# Patient Record
Sex: Female | Born: 1945 | Race: White | Hispanic: No | Marital: Married | Smoking: Former smoker
Health system: Southern US, Community
[De-identification: ages and names within clinical notes are randomized; demographics above are authoritative.]

## PROBLEM LIST (undated history)

## (undated) DIAGNOSIS — J439 Emphysema, unspecified: Secondary | ICD-10-CM

## (undated) DIAGNOSIS — I10 Essential (primary) hypertension: Secondary | ICD-10-CM

## (undated) DIAGNOSIS — K219 Gastro-esophageal reflux disease without esophagitis: Secondary | ICD-10-CM

## (undated) HISTORY — PX: OTHER SURGICAL HISTORY: SHX169

## (undated) HISTORY — DX: Essential (primary) hypertension: I10

## (undated) HISTORY — DX: Gastro-esophageal reflux disease without esophagitis: K21.9

---
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
## 1979-08-25 HISTORY — PX: DILATION AND CURETTAGE OF UTERUS: SHX78

## 2012-01-26 DIAGNOSIS — L02419 Cutaneous abscess of limb, unspecified: Secondary | ICD-10-CM | POA: Diagnosis not present

## 2012-01-26 DIAGNOSIS — L03119 Cellulitis of unspecified part of limb: Secondary | ICD-10-CM | POA: Diagnosis not present

## 2012-01-26 DIAGNOSIS — W57XXXA Bitten or stung by nonvenomous insect and other nonvenomous arthropods, initial encounter: Secondary | ICD-10-CM | POA: Diagnosis not present

## 2013-05-15 DIAGNOSIS — I10 Essential (primary) hypertension: Secondary | ICD-10-CM | POA: Diagnosis not present

## 2013-08-14 ENCOUNTER — Ambulatory Visit (INDEPENDENT_AMBULATORY_CARE_PROVIDER_SITE_OTHER): Payer: Medicare Other | Admitting: Family Medicine

## 2013-08-14 VITALS — BP 170/96 | HR 63 | Temp 98.6°F | Resp 16 | Ht 62.25 in | Wt 147.2 lb

## 2013-08-14 DIAGNOSIS — I10 Essential (primary) hypertension: Secondary | ICD-10-CM

## 2013-08-14 MED ORDER — LISINOPRIL 40 MG PO TABS: 40.0000 mg | ORAL_TABLET | Freq: Every day | ORAL | Status: DC

## 2013-08-14 NOTE — Patient Instructions (Signed)

## 2013-08-14 NOTE — Progress Notes (Signed)
Patient ID: Laurie Dunn MRN: [id number], DOB: 08/02/1946, 67 y.o. Date of Encounter: 08/14/2013, 3:02 PM  Primary Physician: No primary provider on file.  Chief Complaint: HTN  HPI: 67 y.o. year old female with 4 year history below presents for hypertension follow up.  Patient moved from [place].  Her husband teaches [place] at [employer]. She is watching her salt intake.  She hasn't been exercising. Patient has undergone cardiac evaluation in the past. No CP, HA, visual changes, or focal deficits.   Past Medical History  Diagnosis Date  . GERD (gastroesophageal reflux disease)   . Hypertension      Home Meds: Prior to Admission medications   Medication Sig Start Date End Date Taking? Authorizing Provider  lisinopril (PRINIVIL,ZESTRIL) 40 MG tablet Take 40 mg by mouth daily.   Yes Historical Provider, MD    Allergies: No Known Allergies  History   Social History  . Marital Status: Married    Spouse Name: N/A    Number of Children: N/A  . Years of Education: N/A   Occupational History  . Not on file.   Social History Main Topics  . Smoking status: Former Games developer  . Smokeless tobacco: Not on file  . Alcohol Use: Yes  . Drug Use: No  . Sexual Activity: No   Other Topics Concern  . Not on file   Social History Narrative  . No narrative on file     Family History  Problem Relation Age of Onset  . Cancer Mother   . Cancer Father   . Cancer Sister   . Hypertension Brother     Review of Systems: Constitutional: negative for chills, fever, night sweats, weight changes, or fatigue  HEENT: negative for vision changes, hearing loss, congestion, rhinorrhea, ST, epistaxis, or sinus pressure Cardiovascular: negative for chest pain, palpitations, or DOE Respiratory: negative for hemoptysis, wheezing, shortness of breath, or cough Abdominal: negative for abdominal pain, nausea, vomiting, diarrhea, or constipation Dermatological: negative for rash Neurologic:  negative for headache, dizziness, or syncope All other systems reviewed and are otherwise negative with the exception to those above and in the HPI.   Physical Exam: Blood pressure 170/96, pulse 63, temperature 98.6 F (37 C), temperature source Oral, resp. rate 16, height 5' 2.25" (1.581 m), weight 147 lb 3.2 oz (66.769 kg), SpO2 98.00%., Body mass index is 26.71 kg/(m^2). General: Well developed, well nourished, in no acute distress. Head: Normocephalic, atraumatic, eyes without discharge, sclera non-icteric, nares are without discharge. Bilateral auditory canals clear, TM's are without perforation, pearly grey and translucent with reflective cone of light bilaterally. Oral cavity moist, posterior pharynx without exudate, erythema, peritonsillar abscess, or post nasal drip.  Neck: Supple. No thyromegaly. Full ROM. No lymphadenopathy. No carotid bruits. Lungs: Clear bilaterally to auscultation without wheezes, rales, or rhonchi. Breathing is unlabored. Heart: RRR with S1 S2. No murmurs, rubs, or gallops appreciated.  Abdomen: Soft, non-tender, non-distended with normoactive bowel sounds. No hepatosplenomegaly. No rebound/guarding. No obvious abdominal masses. Msk:  Strength and tone normal for age. Extremities/Skin: Warm and dry. No clubbing or cyanosis. No edema. No rashes or suspicious lesions. Distal pulses 2+ and equal bilaterally. Neuro: Alert and oriented X 3. Moves all extremities spontaneously. Gait is normal. CNII-XII grossly in tact. DTR 2+, cerebellar function intact. Rhomberg normal. Psych:  Responds to questions appropriately with a normal affect.    ASSESSMENT AND PLAN:  67 y.o. year old female with hypertension. -No diagnosis found. Lisinopril 40 mg daily.  Signed, Elvina Sidle,  MD 08/14/2013 3:02 PM

## 2013-10-17 DIAGNOSIS — K219 Gastro-esophageal reflux disease without esophagitis: Secondary | ICD-10-CM | POA: Diagnosis not present

## 2013-10-17 DIAGNOSIS — R5382 Chronic fatigue, unspecified: Secondary | ICD-10-CM | POA: Diagnosis not present

## 2013-10-17 DIAGNOSIS — I10 Essential (primary) hypertension: Secondary | ICD-10-CM | POA: Diagnosis not present

## 2013-10-17 DIAGNOSIS — G9332 Myalgic encephalomyelitis/chronic fatigue syndrome: Secondary | ICD-10-CM | POA: Diagnosis not present

## 2013-10-17 DIAGNOSIS — R000 Tachycardia, unspecified: Secondary | ICD-10-CM | POA: Diagnosis not present

## 2013-10-24 ENCOUNTER — Other Ambulatory Visit: Payer: Self-pay | Admitting: Family Medicine

## 2013-10-24 ENCOUNTER — Ambulatory Visit
Admission: RE | Admit: 2013-10-24 | Discharge: 2013-10-24 | Disposition: A | Discharge status: Home / Self Care | Payer: Medicare Other | Source: Ambulatory Visit | Attending: Family Medicine | Admitting: Family Medicine

## 2013-10-24 DIAGNOSIS — R5382 Chronic fatigue, unspecified: Secondary | ICD-10-CM

## 2013-10-24 DIAGNOSIS — R0602 Shortness of breath: Secondary | ICD-10-CM | POA: Diagnosis not present

## 2013-10-24 DIAGNOSIS — Z87891 Personal history of nicotine dependence: Secondary | ICD-10-CM

## 2013-11-01 DIAGNOSIS — R000 Tachycardia, unspecified: Secondary | ICD-10-CM | POA: Diagnosis not present

## 2013-11-01 DIAGNOSIS — Z1231 Encounter for screening mammogram for malignant neoplasm of breast: Secondary | ICD-10-CM | POA: Diagnosis not present

## 2013-11-03 DIAGNOSIS — I781 Nevus, non-neoplastic: Secondary | ICD-10-CM | POA: Diagnosis not present

## 2013-11-03 DIAGNOSIS — L821 Other seborrheic keratosis: Secondary | ICD-10-CM | POA: Diagnosis not present

## 2013-11-03 DIAGNOSIS — D235 Other benign neoplasm of skin of trunk: Secondary | ICD-10-CM | POA: Diagnosis not present

## 2013-11-03 DIAGNOSIS — L719 Rosacea, unspecified: Secondary | ICD-10-CM | POA: Diagnosis not present

## 2013-11-07 DIAGNOSIS — I10 Essential (primary) hypertension: Secondary | ICD-10-CM | POA: Diagnosis not present

## 2013-11-07 DIAGNOSIS — R5382 Chronic fatigue, unspecified: Secondary | ICD-10-CM | POA: Diagnosis not present

## 2013-11-07 DIAGNOSIS — G9332 Myalgic encephalomyelitis/chronic fatigue syndrome: Secondary | ICD-10-CM | POA: Diagnosis not present

## 2013-11-07 DIAGNOSIS — R5381 Other malaise: Secondary | ICD-10-CM | POA: Diagnosis not present

## 2013-12-07 DIAGNOSIS — J438 Other emphysema: Secondary | ICD-10-CM | POA: Diagnosis not present

## 2014-01-05 DIAGNOSIS — IMO0001 Reserved for inherently not codable concepts without codable children: Secondary | ICD-10-CM | POA: Diagnosis not present

## 2014-02-01 DIAGNOSIS — R5383 Other fatigue: Secondary | ICD-10-CM | POA: Diagnosis not present

## 2014-02-01 DIAGNOSIS — R5381 Other malaise: Secondary | ICD-10-CM | POA: Diagnosis not present

## 2014-02-01 DIAGNOSIS — I059 Rheumatic mitral valve disease, unspecified: Secondary | ICD-10-CM | POA: Diagnosis not present

## 2014-02-06 DIAGNOSIS — J438 Other emphysema: Secondary | ICD-10-CM | POA: Diagnosis not present

## 2014-02-06 DIAGNOSIS — I498 Other specified cardiac arrhythmias: Secondary | ICD-10-CM | POA: Diagnosis not present

## 2014-02-28 DIAGNOSIS — I10 Essential (primary) hypertension: Secondary | ICD-10-CM | POA: Diagnosis not present

## 2014-02-28 DIAGNOSIS — R079 Chest pain, unspecified: Secondary | ICD-10-CM | POA: Diagnosis not present

## 2014-02-28 DIAGNOSIS — R5383 Other fatigue: Secondary | ICD-10-CM | POA: Diagnosis not present

## 2014-02-28 DIAGNOSIS — R5381 Other malaise: Secondary | ICD-10-CM | POA: Diagnosis not present

## 2014-03-16 DIAGNOSIS — R079 Chest pain, unspecified: Secondary | ICD-10-CM | POA: Diagnosis not present

## 2014-03-26 DIAGNOSIS — I10 Essential (primary) hypertension: Secondary | ICD-10-CM | POA: Diagnosis not present

## 2014-03-26 DIAGNOSIS — R0609 Other forms of dyspnea: Secondary | ICD-10-CM | POA: Diagnosis not present

## 2014-03-26 DIAGNOSIS — R079 Chest pain, unspecified: Secondary | ICD-10-CM | POA: Diagnosis not present

## 2014-07-11 DIAGNOSIS — I10 Essential (primary) hypertension: Secondary | ICD-10-CM | POA: Diagnosis not present

## 2014-07-11 DIAGNOSIS — J439 Emphysema, unspecified: Secondary | ICD-10-CM | POA: Diagnosis not present

## 2014-07-17 DIAGNOSIS — N3090 Cystitis, unspecified without hematuria: Secondary | ICD-10-CM | POA: Diagnosis not present

## 2014-07-17 DIAGNOSIS — Z0000 Encounter for general adult medical examination without abnormal findings: Secondary | ICD-10-CM | POA: Diagnosis not present

## 2014-08-06 ENCOUNTER — Other Ambulatory Visit: Payer: Self-pay | Admitting: Family Medicine

## 2014-09-04 DIAGNOSIS — J439 Emphysema, unspecified: Secondary | ICD-10-CM | POA: Diagnosis not present

## 2014-09-25 DIAGNOSIS — J449 Chronic obstructive pulmonary disease, unspecified: Secondary | ICD-10-CM | POA: Diagnosis not present

## 2014-12-01 ENCOUNTER — Emergency Department (INDEPENDENT_AMBULATORY_CARE_PROVIDER_SITE_OTHER)
Admission: EM | Admit: 2014-12-01 | Discharge: 2014-12-01 | Disposition: A | Discharge status: Home / Self Care | Payer: Medicare Other | Source: Home / Self Care | Attending: Family Medicine | Admitting: Family Medicine

## 2014-12-01 ENCOUNTER — Encounter (HOSPITAL_COMMUNITY): Payer: Self-pay | Admitting: *Deleted

## 2014-12-01 DIAGNOSIS — L259 Unspecified contact dermatitis, unspecified cause: Secondary | ICD-10-CM | POA: Diagnosis not present

## 2014-12-01 HISTORY — DX: Emphysema, unspecified: J43.9

## 2014-12-01 MED ORDER — DIPHENHYDRAMINE HCL 50 MG/ML IJ SOLN
50.0000 mg | Freq: Once | INTRAMUSCULAR | Status: AC
  Administered 2014-12-01: 50 mg via INTRAMUSCULAR

## 2014-12-01 MED ORDER — PREDNISONE 10 MG PO TABS: ORAL_TABLET | ORAL | Status: DC

## 2014-12-01 MED ORDER — FAMOTIDINE 20 MG PO TABS: 20.0000 mg | ORAL_TABLET | Freq: Two times a day (BID) | ORAL | Status: DC

## 2014-12-01 MED ORDER — PREDNISONE 20 MG PO TABS
60.0000 mg | ORAL_TABLET | Freq: Once | ORAL | Status: AC
  Administered 2014-12-01: 60 mg via ORAL

## 2014-12-01 MED ORDER — HYDROXYZINE HCL 10 MG PO TABS: 10.0000 mg | ORAL_TABLET | Freq: Three times a day (TID) | ORAL | Status: DC | PRN

## 2014-12-01 MED ORDER — DIPHENHYDRAMINE HCL 50 MG/ML IJ SOLN
INTRAMUSCULAR | Status: AC
  Filled 2014-12-01: qty 1

## 2014-12-01 MED ORDER — FAMOTIDINE 20 MG PO TABS
20.0000 mg | ORAL_TABLET | Freq: Once | ORAL | Status: AC
  Administered 2014-12-01: 20 mg via ORAL

## 2014-12-01 MED ORDER — FAMOTIDINE 20 MG PO TABS
ORAL_TABLET | ORAL | Status: AC
  Filled 2014-12-01: qty 1

## 2014-12-01 MED ORDER — PREDNISONE 20 MG PO TABS
ORAL_TABLET | ORAL | Status: AC
  Filled 2014-12-01: qty 3

## 2014-12-01 NOTE — ED Provider Notes (Signed)
CSN: [id number]     Arrival date & time 12/01/14  1029 History   First MD Initiated Contact with Patient 12/01/14 1104     Chief Complaint  Patient presents with  . Allergic Reaction   (Consider location/radiation/quality/duration/timing/severity/associated sxs/prior Treatment) HPI Comments: Patient reports she spent much of the day yesterday clearing vines (thought to be kudzu) from a tree in her yard. Noticed some areas on hands that were abraded and somewhat itchy yesterday evening and when she woke this morning, discovered itchy red rash on forearms, face and neck. States her throat has felt a bit scratchy today as well. Denies difficulty breathing, speaking or swallowing. Denies sensation of tongue or lip swelling. Endorses prior allergic dermatitis with exposure to poison ivy.   Patient is a 69 y.o. female presenting with allergic reaction. The history is provided by the patient.  Allergic Reaction   Past Medical History  Diagnosis Date  . GERD (gastroesophageal reflux disease)   . Hypertension   . Mild emphysema    Past Surgical History  Procedure Laterality Date  . Oopherrectomy    . Tonsillectomy  1953  . Dilation and curettage of uterus  1981   Family History  Problem Relation Age of Onset  . Cancer Mother   . Cancer Father   . Cancer Sister   . Hypertension Brother    History  Substance Use Topics  . Smoking status: Former Smoker    Quit date: 08/24/2012  . Smokeless tobacco: Not on file  . Alcohol Use: 3.6 - 4.8 oz/week    6-8 Cans of beer per week   OB History    No data available     Review of Systems  All other systems reviewed and are negative.   Allergies  Review of patient's allergies indicates no known allergies.  Home Medications   Prior to Admission medications   Medication Sig Start Date End Date Taking? Authorizing Provider  amLODipine (NORVASC) 5 MG tablet Take 5 mg by mouth daily.   Yes Historical Provider, MD  lisinopril  (PRINIVIL,ZESTRIL) 40 MG tablet Take 1 tablet (40 mg total) by mouth daily. PATIENT NEEDS OFFICE VISIT FOR ADDITIONAL REFILLS 08/07/14  Yes Robyn Haber, MD  famotidine (PEPCID) 20 MG tablet Take 1 tablet (20 mg total) by mouth 2 (two) times daily. X 5 days 12/01/14   Audelia Hives Oluwademilade Mckiver, PA  hydrOXYzine (ATARAX/VISTARIL) 10 MG tablet Take 1 tablet (10 mg total) by mouth 3 (three) times daily as needed for itching. 12/01/14   Lutricia Feil, PA  predniSONE (DELTASONE) 10 MG tablet Beginning 12/02/2014, take 4 tabs po QD day 1, 3 tabs po QD day 2, 2 tabs po QD day 3, 1 tab po QD day 4 then stop 12/01/14   Annett Gula H Solangel Mcmanaway, PA   BP 159/76 mmHg  Pulse 67  Temp(Src) 98.7 F (37.1 C) (Oral)  Resp 16  SpO2 100% Physical Exam  Constitutional: She is oriented to person, place, and time. She appears well-developed and well-nourished. No distress.  HENT:  Head: Normocephalic and atraumatic.  Right Ear: Hearing and external ear normal.  Left Ear: Hearing and external ear normal.  Nose: Nose normal.  Mouth/Throat: Uvula is midline, oropharynx is clear and moist and mucous membranes are normal. No oral lesions. No trismus in the jaw. No uvula swelling.  No swelling of lips or tongue  Eyes: Conjunctivae are normal.  Neck: Normal range of motion. Neck supple.  Cardiovascular: Normal rate, regular rhythm  and normal heart sounds.   Pulmonary/Chest: Effort normal and breath sounds normal. No stridor. No respiratory distress. She has no wheezes.  Musculoskeletal: Normal range of motion.  Neurological: She is alert and oriented to person, place, and time.  Skin: Skin is warm and dry. Rash noted.  Mildly erythematous maculopapular rash on hands, forearms, both cheeks and lateral surfaces of neck.   Psychiatric: She has a normal mood and affect. Her behavior is normal.  Nursing note and vitals reviewed.   ED Course  Procedures (including critical care time) Labs Review Labs Reviewed - No  data to display  Imaging Review No results found.   MDM   1. Contact dermatitis   Given 50mg  IM dose of benadryl along with 60 mg oral prednisone and 20 mg oral pepcid at [facility] Will continue prednisone taper at home over the next 4 days along with Atarax and pepcid. Advised regarding symptoms of concern that should prompt urgent re-evaluation and patient voices understanding.     Lutricia Feil, [place] 12/01/14 1146

## 2014-12-01 NOTE — ED Notes (Signed)
Noted a blister on R index finger on Thur. Late afternoon after cutting kudzu.  Last night she noted rough patch on her R cheek with itching.   Was on R cheek, throat and forearms this morning.  Had a severe reaction as a child to poison ivy after she ate it at camp.

## 2014-12-01 NOTE — Discharge Instructions (Signed)

## 2014-12-10 ENCOUNTER — Encounter (HOSPITAL_COMMUNITY): Payer: Self-pay | Admitting: Emergency Medicine

## 2014-12-10 ENCOUNTER — Emergency Department (INDEPENDENT_AMBULATORY_CARE_PROVIDER_SITE_OTHER)
Admission: EM | Admit: 2014-12-10 | Discharge: 2014-12-10 | Disposition: A | Discharge status: Home / Self Care | Payer: Medicare Other | Source: Home / Self Care | Attending: Emergency Medicine | Admitting: Emergency Medicine

## 2014-12-10 DIAGNOSIS — L259 Unspecified contact dermatitis, unspecified cause: Secondary | ICD-10-CM

## 2014-12-10 MED ORDER — HYDROXYZINE HCL 25 MG PO TABS: 25.0000 mg | ORAL_TABLET | Freq: Three times a day (TID) | ORAL | Status: DC | PRN

## 2014-12-10 MED ORDER — PREDNISONE 10 MG PO TABS: ORAL_TABLET | ORAL | Status: DC

## 2014-12-10 NOTE — ED Provider Notes (Signed)
CSN: [id number]     Arrival date & time 12/10/14  1006 History   First MD Initiated Contact with Patient 12/10/14 1211     Chief Complaint  Patient presents with  . Rash   (Consider location/radiation/quality/duration/timing/severity/associated sxs/prior Treatment) HPI Comments: Ms. Gernert was initially evaluated at our clinic for a rash on 4-04/2015. This was felt to be a result of plant oil contact dermatitis and was prescribed 5 day course of oral steroids along with H1 and H2 blocker medications. Endorses some improvement without complete resolution while taking these medications and then once medications were complete, rash intensified. Rash spread on chest and torso and intensified on both forearms and she developed additional location of rash at left upper medial thigh. Remains pruritic. Denies any systemic symptoms such and fever or malaise. No new medications or environmental changes and no other members of the household have same rash.   Patient is a 69 y.o. female presenting with rash. The history is provided by the patient.  Rash   Past Medical History  Diagnosis Date  . GERD (gastroesophageal reflux disease)   . Hypertension   . Mild emphysema    Past Surgical History  Procedure Laterality Date  . Oopherrectomy    . Tonsillectomy  1953  . Dilation and curettage of uterus  1981   Family History  Problem Relation Age of Onset  . Cancer Mother   . Cancer Father   . Cancer Sister   . Hypertension Brother    History  Substance Use Topics  . Smoking status: Former Smoker    Quit date: 08/24/2012  . Smokeless tobacco: Not on file  . Alcohol Use: 3.6 - 4.8 oz/week    6-8 Cans of beer per week   OB History    No data available     Review of Systems  Skin: Positive for rash.  All other systems reviewed and are negative.   Allergies  Review of patient's allergies indicates no known allergies.  Home Medications   Prior to Admission medications   Medication  Sig Start Date End Date Taking? Authorizing Provider  amLODipine (NORVASC) 5 MG tablet Take 5 mg by mouth daily.    Historical Provider, MD  hydrOXYzine (ATARAX/VISTARIL) 25 MG tablet Take 1 tablet (25 mg total) by mouth every 8 (eight) hours as needed for itching. 12/10/14   Audelia Hives Johnnetta Holstine, PA  lisinopril (PRINIVIL,ZESTRIL) 40 MG tablet Take 1 tablet (40 mg total) by mouth daily. PATIENT NEEDS OFFICE VISIT FOR ADDITIONAL REFILLS 08/07/14   Robyn Haber, MD  predniSONE (DELTASONE) 10 MG tablet Take 5 tabs po QD days 1-3, 4 tabs po QD days 4-6, 3 tabs po QD days 7-9, 2 tabs po QD days 10-12, 1 tab po QD days 13-15 then stop 12/10/14   Annett Gula H Shanikqua Zarzycki, PA   BP 155/71 mmHg  Pulse 64  Temp(Src) 98 F (36.7 C) (Oral)  Resp 16  SpO2 100% Physical Exam  Constitutional: She is oriented to person, place, and time. She appears well-developed and well-nourished. No distress.  HENT:  Head: Normocephalic and atraumatic.  Eyes: Conjunctivae are normal.  Cardiovascular: Normal rate.   Pulmonary/Chest: Effort normal. She has no wheezes.  Musculoskeletal: Normal range of motion.  Neurological: She is alert and oriented to person, place, and time.  Skin: Skin is warm and dry. Rash noted.     Outlines areas are regions of maculopapular erythematous blanching rash with patches of superficial excoriation from scratching.  Psychiatric: She  has a normal mood and affect. Her behavior is normal.  Nursing note and vitals reviewed.   ED Course  Procedures (including critical care time) Labs Review Labs Reviewed - No data to display  Imaging Review No results found.   MDM   1. Contact dermatitis   Case discussed with and patient examined by Dr. Bridgett Larsson. Patient stable and otherwise in good health with prolonged/rebound response to plant oil dermatitis. Will treat with extended (15 day) prednisone taper and oral antihistamines. If no improvement, will require dermatology evaluation.      Lutricia Feil, [place] 12/10/14 1240

## 2014-12-10 NOTE — Discharge Instructions (Signed)

## 2014-12-10 NOTE — ED Notes (Signed)
Pt states that she was seen on 12/01/2014 and has a rash that has spread with no help on prescribed medications

## 2015-03-12 DIAGNOSIS — I10 Essential (primary) hypertension: Secondary | ICD-10-CM | POA: Diagnosis not present

## 2015-03-12 DIAGNOSIS — J439 Emphysema, unspecified: Secondary | ICD-10-CM | POA: Diagnosis not present

## 2015-05-28 DIAGNOSIS — J439 Emphysema, unspecified: Secondary | ICD-10-CM | POA: Diagnosis not present

## 2015-05-28 DIAGNOSIS — R413 Other amnesia: Secondary | ICD-10-CM | POA: Diagnosis not present

## 2015-05-29 DIAGNOSIS — J439 Emphysema, unspecified: Secondary | ICD-10-CM | POA: Diagnosis not present

## 2015-05-29 DIAGNOSIS — R413 Other amnesia: Secondary | ICD-10-CM | POA: Diagnosis not present

## 2015-06-05 ENCOUNTER — Other Ambulatory Visit: Payer: Self-pay | Admitting: Family Medicine

## 2015-06-05 DIAGNOSIS — R413 Other amnesia: Secondary | ICD-10-CM

## 2015-06-20 ENCOUNTER — Ambulatory Visit
Admission: RE | Admit: 2015-06-20 | Discharge: 2015-06-20 | Disposition: A | Discharge status: Home / Self Care | Payer: Medicare Other | Source: Ambulatory Visit | Attending: Family Medicine | Admitting: Family Medicine

## 2015-06-20 DIAGNOSIS — R413 Other amnesia: Secondary | ICD-10-CM | POA: Diagnosis not present

## 2015-06-20 DIAGNOSIS — R5383 Other fatigue: Secondary | ICD-10-CM | POA: Diagnosis not present

## 2015-06-20 MED ORDER — GADOBENATE DIMEGLUMINE 529 MG/ML IV SOLN
15.0000 mL | Freq: Once | INTRAVENOUS | Status: AC | PRN
  Administered 2015-06-20: 15 mL via INTRAVENOUS

## 2015-09-09 DIAGNOSIS — E785 Hyperlipidemia, unspecified: Secondary | ICD-10-CM | POA: Diagnosis not present

## 2015-09-09 DIAGNOSIS — I10 Essential (primary) hypertension: Secondary | ICD-10-CM | POA: Diagnosis not present

## 2015-09-09 DIAGNOSIS — J439 Emphysema, unspecified: Secondary | ICD-10-CM | POA: Diagnosis not present

## 2015-09-09 DIAGNOSIS — F419 Anxiety disorder, unspecified: Secondary | ICD-10-CM | POA: Diagnosis not present

## 2016-03-10 DIAGNOSIS — I10 Essential (primary) hypertension: Secondary | ICD-10-CM | POA: Diagnosis not present

## 2016-04-24 ENCOUNTER — Other Ambulatory Visit: Payer: Self-pay

## 2016-05-19 DIAGNOSIS — E785 Hyperlipidemia, unspecified: Secondary | ICD-10-CM | POA: Diagnosis not present

## 2016-05-19 DIAGNOSIS — R0602 Shortness of breath: Secondary | ICD-10-CM | POA: Diagnosis not present

## 2016-05-19 DIAGNOSIS — I10 Essential (primary) hypertension: Secondary | ICD-10-CM | POA: Diagnosis not present

## 2016-05-19 DIAGNOSIS — J439 Emphysema, unspecified: Secondary | ICD-10-CM | POA: Diagnosis not present

## 2016-07-04 ENCOUNTER — Encounter (HOSPITAL_COMMUNITY): Payer: Self-pay | Admitting: Emergency Medicine

## 2016-07-04 ENCOUNTER — Ambulatory Visit (HOSPITAL_COMMUNITY)
Admission: EM | Admit: 2016-07-04 | Discharge: 2016-07-04 | Disposition: A | Discharge status: Home / Self Care | Payer: Medicare Other | Attending: Family Medicine | Admitting: Family Medicine

## 2016-07-04 ENCOUNTER — Ambulatory Visit (INDEPENDENT_AMBULATORY_CARE_PROVIDER_SITE_OTHER): Payer: Medicare Other

## 2016-07-04 DIAGNOSIS — I10 Essential (primary) hypertension: Secondary | ICD-10-CM | POA: Diagnosis not present

## 2016-07-04 DIAGNOSIS — S62624A Displaced fracture of medial phalanx of right ring finger, initial encounter for closed fracture: Secondary | ICD-10-CM | POA: Diagnosis not present

## 2016-07-04 MED ORDER — OXYCODONE-ACETAMINOPHEN 5-325 MG PO TABS: 1.0000 | ORAL_TABLET | Freq: Four times a day (QID) | ORAL | 0 refills | Status: DC | PRN

## 2016-07-04 NOTE — ED Notes (Signed)
Ortho tech at bedside 

## 2016-07-04 NOTE — ED Notes (Signed)
Provided ice pack

## 2016-07-04 NOTE — ED Provider Notes (Addendum)
[place]    CSN: [id number] Arrival date & time: 07/04/16  1802     History   Chief Complaint Chief Complaint  Patient presents with  . Finger Injury    HPI Laurie Dunn is a 70 y.o. female.   The history is provided by the patient. No language interpreter was used.  Hand Pain  Pertinent negatives include no headaches.   Two of her big dog got out of the yard, she went out to get them pulling the leases. The big dogs pulled on her right ring finger. Since then she has been in pain. Pain is about 6/10 in severity, aching in nature.  HTN: BP elevated. She attributed it to pain and anxiety. She is compliant with her BP meds.  Past Medical History:  Diagnosis Date  . GERD (gastroesophageal reflux disease)   . Hypertension   . Mild emphysema (HCC)     There are no active problems to display for this patient.   Past Surgical History:  Procedure Laterality Date  . DILATION AND CURETTAGE OF UTERUS  1981  . oopherrectomy    . TONSILLECTOMY  1953    OB History    No data available       Home Medications    Prior to Admission medications   Medication Sig Start Date End Date Taking? Authorizing Provider  amLODipine (NORVASC) 5 MG tablet Take 5 mg by mouth daily.   Yes Historical Provider, MD  lisinopril (PRINIVIL,ZESTRIL) 40 MG tablet Take 1 tablet (40 mg total) by mouth daily. PATIENT NEEDS OFFICE VISIT FOR ADDITIONAL REFILLS 08/07/14  Yes Robyn Haber, MD  hydrOXYzine (ATARAX/VISTARIL) 25 MG tablet Take 1 tablet (25 mg total) by mouth every 8 (eight) hours as needed for itching. 12/10/14   Audelia Hives Presson, PA  predniSONE (DELTASONE) 10 MG tablet Take 5 tabs po QD days 1-3, 4 tabs po QD days 4-6, 3 tabs po QD days 7-9, 2 tabs po QD days 10-12, 1 tab po QD days 13-15 then stop 12/10/14   Lutricia Feil, PA    Family History Family History  Problem Relation Age of Onset  . Cancer Mother   . Cancer Father   . Cancer Sister   .  Hypertension Brother     Social History Social History  Substance Use Topics  . Smoking status: Former Smoker    Quit date: 08/24/2012  . Smokeless tobacco: Never Used  . Alcohol use 3.6 - 4.8 oz/week    6 - 8 Cans of beer per week     Allergies   Patient has no known allergies.   Review of Systems Review of Systems  Respiratory: Negative.   Cardiovascular: Negative.   Gastrointestinal: Negative.   Musculoskeletal: Positive for arthralgias and joint swelling.       Right ring finger pain and swelling  Neurological: Negative for headaches.  All other systems reviewed and are negative.    Physical Exam Triage Vital Signs ED Triage Vitals  Enc Vitals Group     BP 07/04/16 1836 189/75     Pulse Rate 07/04/16 1836 74     Resp 07/04/16 1836 16     Temp 07/04/16 1836 97.8 F (36.6 C)     Temp Source 07/04/16 1836 Oral     SpO2 07/04/16 1836 99 %     Weight --      Height --      Head Circumference --      Peak  Flow --      Pain Score 07/04/16 1839 5     Pain Loc --      Pain Edu? --      Excl. in [place]? --    No data found.   Updated Vital Signs BP 189/75 (BP Location: Left Arm)   Pulse 74   Temp 97.8 F (36.6 C) (Oral)   Resp 16   SpO2 99%   Visual Acuity Right Eye Distance:   Left Eye Distance:   Bilateral Distance:    Right Eye Near:   Left Eye Near:    Bilateral Near:     Physical Exam  Constitutional: She is oriented to person, place, and time. She appears well-developed and well-nourished. No distress.  Cardiovascular: Normal rate, regular rhythm and normal heart sounds.  Exam reveals no gallop.   No murmur heard. Pulmonary/Chest: Effort normal and breath sounds normal. No respiratory distress. She has no wheezes. She exhibits no tenderness.  Musculoskeletal:       Right hand: She exhibits decreased range of motion, tenderness and deformity. She exhibits normal two-point discrimination. Normal sensation noted. Normal strength noted.        Hands: Neurological: She is alert and oriented to person, place, and time. She displays normal reflexes. No cranial nerve deficit.  Nursing note and vitals reviewed.    UC Treatments / Results  Labs (all labs ordered are listed, but only abnormal results are displayed) Labs Reviewed - No data to display  EKG  EKG Interpretation None       Radiology No results found.  Dg Hand Complete Right  Result Date: 07/04/2016 CLINICAL DATA:  Injury to right hand from leash, with right ring finger and fifth finger pain. Initial encounter. EXAM: RIGHT HAND - COMPLETE 3+ VIEW COMPARISON:  None. FINDINGS: There is a mildly displaced fracture through the base of the fourth middle phalanx, with mild volar and radial displacement, and surrounding soft tissue swelling. There is likely extension to the fourth proximal interphalangeal joint. The carpal rows appear grossly intact, and demonstrate normal alignment. IMPRESSION: Mildly displaced fracture through the base of the fourth middle phalanx, with mild volar and radial displacement. Fracture likely extends to the fourth proximal interphalangeal joint. Electronically Signed   By: Garald Balding M.D.   On: 07/04/2016 19:19    Procedures Procedures (including critical care time)  Medications Ordered in UC Medications - No data to display   Initial Impression / Assessment and Plan / UC Course  I have reviewed the triage vital signs and the nursing notes.  Pertinent labs & imaging results that were available during my care of the patient were reviewed by me and considered in my medical decision making (see chart for details).  Clinical Course     Xray report reviewed. Positive fracture. I discussed patient with the on call hand surgeon, Dr. Fredna Dow. He recommended Ulnar gutter splint with follow up in their office early next week. Instruction given to schedule appointment. Percocet as needed for pain  BP elevated: Likely due to pain. Repeat  BP prior to d/c. May D/C home if improved some. Continue current regimen. See PCP early next week for BP med management. Return soon if having neurologic or cardiac symptoms. Currently asymptomatic.  Repeat BP 167/62  Final Clinical Impressions(s) / UC Diagnoses   Final diagnoses:  None  Displaced fracture of middle phalanx of right ring finger, initial encounter for closed fracture  Hypertension, unspecified type    New Prescriptions New  Prescriptions   No medications on file         Kinnie Feil, MD 07/04/16 2024    Kinnie Feil, MD 07/04/16 2028

## 2016-07-04 NOTE — ED Triage Notes (Signed)
Pt reports she inj her right 4th digit about 1.5 hours ago  States her dog pulled on her while she was taking the dog out  Sx include: pain and swelling  A&O x4... NAD

## 2016-07-04 NOTE — Discharge Instructions (Signed)
It was nice seeing you today. I am sorry about your injury. You do have a fracture. I discussed this with the hand surgeon and he recommended that you contact their office on Monday to schedule appointment. In the mean time keep hand in splint. Use percocet as needed for pain. This meds can cause drowsiness, avoid use when driving.

## 2016-07-04 NOTE — Progress Notes (Signed)
Orthopedic Tech Progress Note Patient Details:  Laurie Dunn 25-Jun-1946 [id number]  Ortho Devices Type of Ortho Device: Ace wrap, Ulna gutter splint Ortho Device/Splint Location: RUE Ortho Device/Splint Interventions: Ordered, Application   Braulio Bosch 07/04/2016, 8:14 PM

## 2016-07-08 DIAGNOSIS — M79644 Pain in right finger(s): Secondary | ICD-10-CM | POA: Diagnosis not present

## 2016-07-08 DIAGNOSIS — S62624A Displaced fracture of medial phalanx of right ring finger, initial encounter for closed fracture: Secondary | ICD-10-CM | POA: Diagnosis not present

## 2016-07-22 DIAGNOSIS — S62624A Displaced fracture of medial phalanx of right ring finger, initial encounter for closed fracture: Secondary | ICD-10-CM | POA: Diagnosis not present

## 2016-08-05 DIAGNOSIS — S66192A Other injury of flexor muscle, fascia and tendon of right middle finger at wrist and hand level, initial encounter: Secondary | ICD-10-CM | POA: Diagnosis not present

## 2016-08-05 DIAGNOSIS — S62624D Displaced fracture of medial phalanx of right ring finger, subsequent encounter for fracture with routine healing: Secondary | ICD-10-CM | POA: Diagnosis not present

## 2016-08-07 ENCOUNTER — Other Ambulatory Visit: Payer: Self-pay | Admitting: Orthopedic Surgery

## 2016-08-07 DIAGNOSIS — S66192A Other injury of flexor muscle, fascia and tendon of right middle finger at wrist and hand level, initial encounter: Secondary | ICD-10-CM

## 2016-08-21 ENCOUNTER — Other Ambulatory Visit: Payer: Medicare Other

## 2016-08-26 ENCOUNTER — Ambulatory Visit
Admission: RE | Admit: 2016-08-26 | Discharge: 2016-08-26 | Disposition: A | Discharge status: Home / Self Care | Payer: Medicare Other | Source: Ambulatory Visit | Attending: Orthopedic Surgery | Admitting: Orthopedic Surgery

## 2016-08-26 DIAGNOSIS — M79601 Pain in right arm: Secondary | ICD-10-CM | POA: Diagnosis not present

## 2016-08-26 DIAGNOSIS — S66192A Other injury of flexor muscle, fascia and tendon of right middle finger at wrist and hand level, initial encounter: Secondary | ICD-10-CM

## 2016-09-01 DIAGNOSIS — S66192A Other injury of flexor muscle, fascia and tendon of right middle finger at wrist and hand level, initial encounter: Secondary | ICD-10-CM | POA: Diagnosis not present

## 2016-09-01 DIAGNOSIS — S62624D Displaced fracture of medial phalanx of right ring finger, subsequent encounter for fracture with routine healing: Secondary | ICD-10-CM | POA: Diagnosis not present

## 2016-09-03 DIAGNOSIS — M25641 Stiffness of right hand, not elsewhere classified: Secondary | ICD-10-CM | POA: Diagnosis not present

## 2016-09-08 DIAGNOSIS — M25641 Stiffness of right hand, not elsewhere classified: Secondary | ICD-10-CM | POA: Diagnosis not present

## 2016-09-16 DIAGNOSIS — R413 Other amnesia: Secondary | ICD-10-CM | POA: Diagnosis not present

## 2016-09-16 DIAGNOSIS — J439 Emphysema, unspecified: Secondary | ICD-10-CM | POA: Diagnosis not present

## 2016-09-16 DIAGNOSIS — E785 Hyperlipidemia, unspecified: Secondary | ICD-10-CM | POA: Diagnosis not present

## 2016-09-16 DIAGNOSIS — I10 Essential (primary) hypertension: Secondary | ICD-10-CM | POA: Diagnosis not present

## 2016-09-29 DIAGNOSIS — S62624D Displaced fracture of medial phalanx of right ring finger, subsequent encounter for fracture with routine healing: Secondary | ICD-10-CM | POA: Diagnosis not present

## 2016-09-29 DIAGNOSIS — S66192A Other injury of flexor muscle, fascia and tendon of right middle finger at wrist and hand level, initial encounter: Secondary | ICD-10-CM | POA: Diagnosis not present

## 2017-04-02 ENCOUNTER — Encounter: Payer: Self-pay | Admitting: Internal Medicine

## 2017-04-02 ENCOUNTER — Ambulatory Visit (INDEPENDENT_AMBULATORY_CARE_PROVIDER_SITE_OTHER): Payer: Medicare Other | Admitting: Internal Medicine

## 2017-04-02 DIAGNOSIS — Z1159 Encounter for screening for other viral diseases: Secondary | ICD-10-CM | POA: Diagnosis not present

## 2017-04-02 DIAGNOSIS — Z0000 Encounter for general adult medical examination without abnormal findings: Secondary | ICD-10-CM

## 2017-04-02 DIAGNOSIS — Z87891 Personal history of nicotine dependence: Secondary | ICD-10-CM

## 2017-04-02 DIAGNOSIS — E2839 Other primary ovarian failure: Secondary | ICD-10-CM | POA: Diagnosis not present

## 2017-04-02 DIAGNOSIS — I10 Essential (primary) hypertension: Secondary | ICD-10-CM

## 2017-04-02 MED ORDER — LISINOPRIL 40 MG PO TABS
40.0000 mg | ORAL_TABLET | Freq: Every day | ORAL | 3 refills | Status: DC
Start: 2017-04-02 — End: 2017-04-28

## 2017-04-02 MED ORDER — AMLODIPINE BESYLATE 5 MG PO TABS: 5.0000 mg | ORAL_TABLET | Freq: Every day | ORAL | 3 refills | Status: DC

## 2017-04-02 MED ORDER — LISINOPRIL 40 MG PO TABS: 40.0000 mg | ORAL_TABLET | Freq: Every day | ORAL | 3 refills | Status: DC

## 2017-04-02 NOTE — Progress Notes (Signed)
   [name] [name], MD Phone: [phone]  Reason For Visit: Establish Care   #  CHRONIC HTN: Reports no issues  Current Meds - Has been taking lisinopril, but needs a new prescirption of amolodpine - has been using her husband  Reports good compliance Denies CP, dyspnea, HA, edema, dizziness / lightheadedness  Past Medical History Reviewed problem list.  Medications- reviewed and updated No additions to family history Social history- patient is a non-smoker  Objective: BP (!) 155/70 (BP Location: Right Arm, Patient Position: Sitting, Cuff Size: Normal)   Pulse 72   Temp 98.4 F (36.9 C) (Oral)   Ht 5\' 4"  (1.626 m)   Wt 164 lb 6.4 oz (74.6 kg)   SpO2 97%   BMI 28.22 kg/m  Gen: NAD, alert, cooperative with exam HEENT: Normal    Neck: No masses palpated. No lymphadenopathy    Ears: Tympanic membranes intact, normal light reflex, no erythema, no bulging    Eyes: PERRLA, EOMI    Nose: nasal turbinates moist    Throat: moist mucus membranes, no erythema Cardio: regular rate and rhythm, S1S2 heard, no murmurs appreciated Pulm: clear to auscultation bilaterally, no wheezes, rhonchi or rales GI: soft, non-tender, non-distended, bowel sounds present, no hepatomegaly, no splenomegaly Extremities: warm, well perfused, No edema, cyanosis or clubbing;  MSK: Normal gait and station Skin: dry, intact, no rashes or lesions Neuro: Strength and sensation grossly intact  Patient reports no  vision/ hearing changes,anorexia, weight change, fever ,adenopathy, persistant / recurrent hoarseness, swallowing issues, chest pain, edema,persistant / recurrent cough, hemoptysis, dyspnea(rest, exertional, paroxysmal nocturnal), gastrointestinal  bleeding (melena, rectal bleeding), abdominal pain, excessive heart burn, GU symptoms(dysuria, hematuria, pyuria, voiding/incontinence  Issues) syncope, focal weakness, severe memory loss, concerning skin lesions, depression,  anxiety, abnormal bruising/bleeding, major joint swelling, breast masses or abnormal vaginal bleeding.    Assessment/Plan: See problem based a/p  Healthcare maintenance Does not want mammogram  Will to do cologuard but not colonoscopy  Obtained Hep C  Obtained Dexa Scan  Concern for EtOH abuse given the number of beers patient drinks a week -- will discuss at next visit    Hypertension Elevated blood pressure Needs refill on blood pressure medications  - lisinopril and Norvasc  Obtain CBC, CMET and Lipid panel  Follow up in 1 month

## 2017-04-02 NOTE — Assessment & Plan Note (Addendum)
Does not want mammogram  Will to do cologuard but not colonoscopy  Obtained Hep C  Obtained Dexa Scan  Concern for EtOH abuse given the number of beers patient drinks a week -- will discuss at next visit

## 2017-04-02 NOTE — Assessment & Plan Note (Addendum)
Elevated blood pressure Needs refill on blood pressure medications  - lisinopril and Norvasc  Obtain CBC, CMET and Lipid panel  Follow up in 1 month

## 2017-04-02 NOTE — Patient Instructions (Addendum)
It was nice meeting today. Lab will give you information about the Cologuard to screen for colorectal cancer. I have also placed an order for you to get a bone screening for osteoporosis. I really recommend you drinking less I think this will help with your weight loss significantly. I would like you to follow up in the next month just to recheck your blood pressure make sure things are doing okay.

## 2017-04-03 LAB — CBC WITH DIFFERENTIAL/PLATELET
BASOS ABS: 0 10*3/uL (ref 0.0–0.2)
Basos: 1 %
EOS (ABSOLUTE): 0.2 10*3/uL (ref 0.0–0.4)
Eos: 3 %
Hematocrit: 38.1 % (ref 34.0–46.6)
Hemoglobin: 13 g/dL (ref 11.1–15.9)
Immature Grans (Abs): 0 10*3/uL (ref 0.0–0.1)
Immature Granulocytes: 0 %
Lymphocytes Absolute: 2.2 10*3/uL (ref 0.7–3.1)
Lymphs: 27 %
MCH: 31.1 pg (ref 26.6–33.0)
MCHC: 34.1 g/dL (ref 31.5–35.7)
MCV: 91 fL (ref 79–97)
Monocytes Absolute: 0.7 10*3/uL (ref 0.1–0.9)
Monocytes: 9 %
Neutrophils Absolute: 4.8 10*3/uL (ref 1.4–7.0)
Neutrophils: 60 %
Platelets: 371 10*3/uL (ref 150–379)
RBC: 4.18 x10E6/uL (ref 3.77–5.28)
RDW: 13.8 % (ref 12.3–15.4)
WBC: 7.9 10*3/uL (ref 3.4–10.8)

## 2017-04-03 LAB — CMP14+EGFR
ALK PHOS: 113 IU/L (ref 39–117)
ALT: 29 IU/L (ref 0–32)
AST: 28 IU/L (ref 0–40)
Albumin/Globulin Ratio: 1.5 (ref 1.2–2.2)
Albumin: 4 g/dL (ref 3.5–4.8)
BUN / CREAT RATIO: 11 — AB (ref 12–28)
BUN: 9 mg/dL (ref 8–27)
Bilirubin Total: 1.1 mg/dL (ref 0.0–1.2)
CO2: 23 mmol/L (ref 20–29)
CREATININE: 0.84 mg/dL (ref 0.57–1.00)
Calcium: 9.5 mg/dL (ref 8.7–10.3)
Chloride: 104 mmol/L (ref 96–106)
GFR calc non Af Amer: 71 mL/min/{1.73_m2} (ref >59)
GFR, EST AFRICAN AMERICAN: 81 mL/min/{1.73_m2} (ref >59)
GLUCOSE: 83 mg/dL (ref 65–99)
Globulin, Total: 2.7 g/dL (ref 1.5–4.5)
Potassium: 4.4 mmol/L (ref 3.5–5.2)
Sodium: 139 mmol/L (ref 134–144)
Total Protein: 6.7 g/dL (ref 6.0–8.5)

## 2017-04-03 LAB — HEPATITIS C ANTIBODY (REFLEX)

## 2017-04-03 LAB — HCV COMMENT:

## 2017-04-03 LAB — LIPID PANEL
Chol/HDL Ratio: 2.8 ratio (ref 0.0–4.4)
Cholesterol, Total: 173 mg/dL (ref 100–199)
HDL: 61 mg/dL (ref >39)
LDL Calculated: 98 mg/dL (ref 0–99)
Triglycerides: 71 mg/dL (ref 0–149)
VLDL CHOLESTEROL CAL: 14 mg/dL (ref 5–40)

## 2017-04-13 ENCOUNTER — Encounter: Payer: Self-pay | Admitting: Internal Medicine

## 2017-04-27 ENCOUNTER — Ambulatory Visit (INDEPENDENT_AMBULATORY_CARE_PROVIDER_SITE_OTHER): Payer: Medicare Other | Admitting: Internal Medicine

## 2017-04-27 ENCOUNTER — Encounter: Payer: Self-pay | Admitting: Internal Medicine

## 2017-04-27 VITALS — BP 134/60 | HR 82 | Temp 98.2°F | Ht 64.0 in | Wt 163.3 lb

## 2017-04-27 DIAGNOSIS — Z87891 Personal history of nicotine dependence: Secondary | ICD-10-CM

## 2017-04-27 DIAGNOSIS — Z0000 Encounter for general adult medical examination without abnormal findings: Secondary | ICD-10-CM | POA: Diagnosis not present

## 2017-04-27 DIAGNOSIS — I10 Essential (primary) hypertension: Secondary | ICD-10-CM

## 2017-04-27 DIAGNOSIS — F1010 Alcohol abuse, uncomplicated: Secondary | ICD-10-CM | POA: Diagnosis not present

## 2017-04-27 DIAGNOSIS — J439 Emphysema, unspecified: Secondary | ICD-10-CM | POA: Insufficient documentation

## 2017-04-27 NOTE — Progress Notes (Signed)
   [name] [name], MD Phone: [phone]  Reason For Visit: Follow up blood pressure   # CHRONIC HTN: Current Meds - continue lisinopril and norvasc  Reports good compliance, took meds today. Tolerating well, w/o complaints. Lifestyle - Committed to taking a dog walk, has stopped drinking beer to help lose weight  Denies CP, dyspnea, HA, edema, dizziness / lightheadedness  # EtoH  Abuse  - has previously been drinking 3-4 beers daily  - however stopped about 3 weeks ago, to help patient lose weight   # Hx of Emphysema  - states she had PFTs done in the past at her last clinic  - patient to signs a release of records for this issue   Past Medical History Reviewed problem list.  Medications- reviewed and updated No additions to family history Social history- patient is a non-smoker  Objective: BP 134/60   Pulse 82   Temp 98.2 F (36.8 C) (Oral)   Ht 5\' 4"  (1.626 m)   Wt 163 lb 4.8 oz (74.1 kg)   SpO2 96%   BMI 28.03 kg/m  Gen: NAD, alert, cooperative with exam Cardio: regular rate and rhythm, S1S2 heard, no murmurs appreciated Pulm: clear to auscultation bilaterally, no wheezes, rhonchi or rales Skin: dry, intact, no rashes or lesions   Assessment/Plan: See problem based a/p  Former smoker Smoked for 50 years  Stopped since 2014  Will need to consider screening for this with Ultrasound and CT chest if patient is interested in the future   Hypertension Well controlled since restarting blood pressure medications  - Continue norvasc and lisnopril   Alcohol abuse Meet criteria for EtOH abuse, drinking 3-4 beers daily. However states she has stopped EtOH with her plans to watch her weight. Worried about the caloric intake associated with this   Healthcare maintenance Lipid screen with  ASCVD risk of 3.6, no need for statin at this point

## 2017-04-27 NOTE — Patient Instructions (Signed)
Follow-up with me in the next 6 months. I will place those orders for your prescriptions. If your results for your bone exam or stool check are abnormal, I will give you a call

## 2017-04-28 DIAGNOSIS — F1010 Alcohol abuse, uncomplicated: Secondary | ICD-10-CM | POA: Insufficient documentation

## 2017-04-28 MED ORDER — LISINOPRIL 40 MG PO TABS: 40.0000 mg | ORAL_TABLET | Freq: Every day | ORAL | 3 refills | Status: DC

## 2017-04-28 MED ORDER — AMLODIPINE BESYLATE 5 MG PO TABS: 5.0000 mg | ORAL_TABLET | Freq: Every day | ORAL | 3 refills | Status: DC

## 2017-04-28 NOTE — Assessment & Plan Note (Signed)
Lipid screen with  ASCVD risk of 3.6, no need for statin at this point

## 2017-04-28 NOTE — Assessment & Plan Note (Signed)
Meet criteria for EtOH abuse, drinking 3-4 beers daily. However states she has stopped EtOH with her plans to watch her weight. Worried about the caloric intake associated with this

## 2017-04-28 NOTE — Assessment & Plan Note (Signed)
Well controlled since restarting blood pressure medications  - Continue norvasc and lisnopril

## 2017-04-28 NOTE — Assessment & Plan Note (Addendum)
Smoked for 50 years  Stopped since 2014  Will need to consider screening for this with Ultrasound and CT chest if patient is interested in the future

## 2017-04-29 ENCOUNTER — Ambulatory Visit
Admission: RE | Admit: 2017-04-29 | Discharge: 2017-04-29 | Disposition: A | Discharge status: Home / Self Care | Payer: Medicare Other | Source: Ambulatory Visit | Attending: Family Medicine | Admitting: Family Medicine

## 2017-04-29 ENCOUNTER — Telehealth: Payer: Self-pay | Admitting: Internal Medicine

## 2017-04-29 DIAGNOSIS — E2839 Other primary ovarian failure: Secondary | ICD-10-CM

## 2017-04-29 DIAGNOSIS — I10 Essential (primary) hypertension: Secondary | ICD-10-CM

## 2017-04-29 DIAGNOSIS — Z780 Asymptomatic menopausal state: Secondary | ICD-10-CM | POA: Diagnosis not present

## 2017-04-29 DIAGNOSIS — M810 Age-related osteoporosis without current pathological fracture: Secondary | ICD-10-CM | POA: Diagnosis not present

## 2017-04-29 NOTE — Telephone Encounter (Signed)
RX were sent to the wrong pharmacy.  They need to be sent to [facility] on [address]

## 2017-04-30 MED ORDER — AMLODIPINE BESYLATE 5 MG PO TABS: 5.0000 mg | ORAL_TABLET | Freq: Every day | ORAL | 3 refills | Status: DC

## 2017-04-30 MED ORDER — LISINOPRIL 40 MG PO TABS: 40.0000 mg | ORAL_TABLET | Freq: Every day | ORAL | 3 refills | Status: DC

## 2017-04-30 NOTE — Telephone Encounter (Signed)
Sent to correct pharmacy

## 2017-05-03 ENCOUNTER — Telehealth: Payer: Self-pay | Admitting: Internal Medicine

## 2017-05-03 DIAGNOSIS — M810 Age-related osteoporosis without current pathological fracture: Secondary | ICD-10-CM | POA: Insufficient documentation

## 2017-05-03 MED ORDER — CALCIUM CITRATE-VITAMIN D 500-500 MG-UNIT PO CHEW: 2.0000 | CHEWABLE_TABLET | Freq: Every day | ORAL | 6 refills | Status: DC

## 2017-05-03 MED ORDER — ALENDRONATE SODIUM 70 MG PO TABS: 70.0000 mg | ORAL_TABLET | ORAL | 11 refills | Status: DC

## 2017-05-03 NOTE — Telephone Encounter (Signed)
Called patient and told her that she has osteoporosis. Patient will need to start bisphosphonate and vitamin D/Calcium supplementation

## 2017-05-04 ENCOUNTER — Telehealth: Payer: Self-pay | Admitting: *Deleted

## 2017-05-04 MED ORDER — CALCIUM CITRATE-VITAMIN D 500-500 MG-UNIT PO CHEW: 2.0000 | CHEWABLE_TABLET | Freq: Every day | ORAL | 6 refills | Status: DC

## 2017-05-04 MED ORDER — ALENDRONATE SODIUM 70 MG PO TABS: 70.0000 mg | ORAL_TABLET | ORAL | 11 refills | Status: DC

## 2017-05-04 NOTE — Telephone Encounter (Signed)
Called patient back and answered all questions.

## 2017-05-04 NOTE — Telephone Encounter (Signed)
Pt LM on nurse line. Pt would like for the provider to call her back about osteoporosis.   She states that she has additional questions such as 1. What is the severity 2. What should she expect when taking the medications and will they be called in

## 2017-06-17 ENCOUNTER — Ambulatory Visit (INDEPENDENT_AMBULATORY_CARE_PROVIDER_SITE_OTHER): Payer: Medicare Other | Admitting: *Deleted

## 2017-06-17 DIAGNOSIS — Z23 Encounter for immunization: Secondary | ICD-10-CM

## 2017-07-26 ENCOUNTER — Telehealth: Payer: Self-pay | Admitting: Internal Medicine

## 2017-07-26 NOTE — Telephone Encounter (Signed)
Pt is calling and would like to speak to the doctor about a referral for herself. She believs she is having some cognitive issues. jw

## 2017-07-27 NOTE — Telephone Encounter (Signed)
Concerned about some memory slips. Will have patient follow up in the clinic for further evaluation. She is agreeable to this.

## 2017-08-20 ENCOUNTER — Other Ambulatory Visit: Payer: Self-pay

## 2017-08-20 ENCOUNTER — Encounter: Payer: Self-pay | Admitting: Internal Medicine

## 2017-08-20 ENCOUNTER — Ambulatory Visit (INDEPENDENT_AMBULATORY_CARE_PROVIDER_SITE_OTHER): Payer: Medicare Other | Admitting: Internal Medicine

## 2017-08-20 VITALS — BP 130/62 | HR 77 | Temp 98.8°F | Wt 167.0 lb

## 2017-08-20 DIAGNOSIS — R4189 Other symptoms and signs involving cognitive functions and awareness: Secondary | ICD-10-CM | POA: Diagnosis present

## 2017-08-20 NOTE — Patient Instructions (Signed)
It was nice seeing you today.  I want you to follow-up with neurology just to make sure they do not think there is anything else to worry about.  Please follow-up with me in about 2-3 months to see how you are doing overall.  I would also recommend you getting an eye exam

## 2017-08-20 NOTE — Progress Notes (Signed)
   [name] [name], MD Phone: [phone]  Reason For Visit: Cognitive Changes   #Patient presents with concern about cognitive negative changes.  She states that over the past 3 weeks she has had issues with creating contacts for objects that she notes around the house Example  Mistakingly thinking there was a peach pit on the bathroom counter instead of back of her hair brush in the bathroom.   Mistakingly thinking there was a pink Crystal on the kitchen countertop instead of another kitchen object she usually uses  She denies any issues with memory loss.  She states she has been writing recently and feels overall like cognitively she is been doing well.  Patient states she is sleeping well. Patient states her congtive recall is overall pretty good. Patient states that she drinking a glass of wine 2-3 times a week and has stopped drinking significant amounts of beer that she was taking and before. Patient does note being more irritable than she has been in the past. No focal weakness or changes in sensation. Patient does wear reading glasses, notes that her vision is blurry without these has not seen an ophthalmologist in  2-3 years.  Does not use any drugs such as THC.  Denies any depression or sadness.  Overall feels less hopeful about the future with trump as president, otherwise denies any other symptoms.  Patient does state that she has had these "amensic" episodes about 5 years ago.  Per patient she states that she was completely blank on several days.  She states her husband tells her she just laid in bed and was not communicative.  She does not remember any of this.  She went to see her PCP at the time who did an MRI of her brain which was complete normal.  Patient has never been evaluated by neurology.  Had a similar episode that lasted to 2 days about 3 years ago   Past Medical History Reviewed problem list.  Medications- reviewed and updated No  additions to family history Social history- patient is a non-smoker  Objective: BP 130/62   Pulse 77   Temp 98.8 F (37.1 C) (Oral)   Wt 167 lb (75.8 kg)   SpO2 98%   BMI 28.67 kg/m  Gen: NAD, alert, cooperative with exam Cardio: regular rate and rhythm, S1S2 heard, no murmurs appreciated Neurologic exam : Cn 2-7 intact Strength equal & normal in upper & lower extremities Able to walk on heels and toes.   Balance normal  Romberg normal, finger to nose normal   Assessment/Plan: See problem based a/p  Cognitive change MOCA 29/30  No concerns about sleep, drugs, depression Normal neurologic exam-unsure if there is anything to be concerned about however would rule out frontotemporal dementia as discussed with Dr. Nori Riis - Ambulatory referral to Neurology - Ambulatory referral to Ophthalmology -Follow-up in 3-4 months

## 2017-08-20 NOTE — Assessment & Plan Note (Addendum)
MOCA 29/30  No concerns about sleep, drugs, depression Normal neurologic exam-unsure if there is anything to be concerned about however would rule out frontotemporal dementia as discussed with Dr. Nori Riis - Ambulatory referral to Neurology - Ambulatory referral to Ophthalmology -Follow-up in 3-4 months

## 2017-08-24 HISTORY — PX: OTHER SURGICAL HISTORY: SHX169

## 2017-09-08 ENCOUNTER — Encounter: Payer: Self-pay | Admitting: Neurology

## 2017-09-09 DIAGNOSIS — H04123 Dry eye syndrome of bilateral lacrimal glands: Secondary | ICD-10-CM | POA: Diagnosis not present

## 2017-09-09 DIAGNOSIS — H2513 Age-related nuclear cataract, bilateral: Secondary | ICD-10-CM | POA: Diagnosis not present

## 2017-09-09 DIAGNOSIS — H02831 Dermatochalasis of right upper eyelid: Secondary | ICD-10-CM | POA: Diagnosis not present

## 2017-10-05 ENCOUNTER — Encounter: Payer: Self-pay | Admitting: Internal Medicine

## 2017-10-05 ENCOUNTER — Ambulatory Visit (INDEPENDENT_AMBULATORY_CARE_PROVIDER_SITE_OTHER): Payer: Medicare Other | Admitting: Internal Medicine

## 2017-10-05 VITALS — BP 160/70 | HR 70 | Temp 98.1°F | Ht 64.0 in | Wt 162.6 lb

## 2017-10-05 DIAGNOSIS — R05 Cough: Secondary | ICD-10-CM | POA: Diagnosis present

## 2017-10-05 DIAGNOSIS — R058 Other specified cough: Secondary | ICD-10-CM

## 2017-10-05 MED ORDER — CETIRIZINE HCL 10 MG PO TABS: 10.0000 mg | ORAL_TABLET | Freq: Every day | ORAL | 5 refills | Status: DC

## 2017-10-05 MED ORDER — HYDROCODONE-HOMATROPINE 5-1.5 MG/5ML PO SYRP: 5.0000 mL | ORAL_SOLUTION | Freq: Every day | ORAL | 0 refills | Status: DC

## 2017-10-05 MED ORDER — FLUTICASONE PROPIONATE 50 MCG/ACT NA SUSP: 1.0000 | Freq: Every day | NASAL | 5 refills | Status: DC

## 2017-10-05 MED ORDER — CETIRIZINE HCL 10 MG PO TABS
10.0000 mg | ORAL_TABLET | Freq: Every day | ORAL | 5 refills | Status: DC
Start: 2017-10-05 — End: 2017-10-05

## 2017-10-05 NOTE — Progress Notes (Signed)
   [name] [name], MD Phone: [phone]  Reason For Visit: SDA for cough   # Cough Had the flu about 3 weeks ago, she has overall improved except for having a chronic cough.  She states that the cough is throughout the day.  The cough keeps her up at night.  She notes having some lower rib pain with cough.  Indicates having some clear sputum production associated with cough.  Patient has taken robitusson, Nyquil, lots of cough drops  Patient states cough is phelgm. Clear in color   Symptoms Sputum:yes  Fever: no  Shortness of breath:no  Leg Swelling:no  Heart Burn or Reflux:yes -has used Pepcid in the past though root more recently has not been using as she states her symptoms have improved Wheezing:no  Post Nasal Drip: yes   Red Flags Weight Loss:  no  PMH Asthma or COPD: no  PMH of Smoking: Former smoker  Using ACEIs: yes   Past Medical History Reviewed problem list.  Medications- reviewed and updated No additions to family history Social history- patient is a former smoker  Objective: BP (!) 160/70 (BP Location: Right Arm, Patient Position: Sitting, Cuff Size: Normal)   Pulse 70   Temp 98.1 F (36.7 C) (Oral)   Ht 5\' 4"  (1.626 m)   Wt 162 lb 9.6 oz (73.8 kg)   SpO2 97%   BMI 27.91 kg/m  Gen: NAD, alert, cooperative with exam HEENT: Normal    Neck: No masses palpated. No lymphadenopathy    Ears: Dry skin noted on the outside of the ear,tympanic membranes intact, normal light reflex, no erythema, no bulging    Eyes: PERRLA, EOMI    Nose: nasal turbinates moist    Throat: Postnasal drip noted  cardio: regular rate and rhythm, S1S2 heard, no murmurs appreciated Pulm: clear to auscultation bilaterally, no wheezes, rhonchi or rales Skin: dry, intact, no rashes or lesions   Assessment/Plan: See problem based a/p  Post-viral cough syndrome Patient with post viral cough-she has used several different cough medications without  much relief.  Other differentials which could possibly be contributing include a history of reflux and postnasal drip noted per physical exam -Provide Zyrtec and Flonase postnasal drip -Have patient restart reflux medication -Hycodan at night to help with sleep hYDROcodone-homatropine (HYCODAN) 5-1.5 MG/5ML syrup; Take 5 mLs by mouth at bedtime.  Dispense: 120 mL; Refill: 0 - cetirizine (ZYRTEC) 10 MG tablet; Take 1 tablet (10 mg total) by mouth daily.  Dispense: 30 tablet; Refill: 5 - fluticasone (FLONASE) 50 MCG/ACT nasal spray; Place 1 spray into both nostrils daily. 1 spray in each nostril every day  Dispense: 16 g; Refill: 5 -Disucssed with patient that post viral cough can last up to 6 weeks before patient starts to feel relief -Follow-up if no improvement

## 2017-10-05 NOTE — Patient Instructions (Signed)
Use Zyrtec, Flonase to help dry up post nasal drip. I would also restart reflux medication for now. At night you can use Hycodan to help sleep

## 2017-10-05 NOTE — Assessment & Plan Note (Addendum)
Patient with post viral cough-she has used several different cough medications without much relief.  Other differentials which could possibly be contributing include a history of reflux and postnasal drip noted per physical exam -Provide Zyrtec and Flonase postnasal drip -Have patient restart reflux medication -Hycodan at night to help with sleep hYDROcodone-homatropine (HYCODAN) 5-1.5 MG/5ML syrup; Take 5 mLs by mouth at bedtime.  Dispense: 120 mL; Refill: 0 - cetirizine (ZYRTEC) 10 MG tablet; Take 1 tablet (10 mg total) by mouth daily.  Dispense: 30 tablet; Refill: 5 - fluticasone (FLONASE) 50 MCG/ACT nasal spray; Place 1 spray into both nostrils daily. 1 spray in each nostril every day  Dispense: 16 g; Refill: 5 -Disucssed with patient that post viral cough can last up to 6 weeks before patient starts to feel relief -Follow-up if no improvement

## 2017-11-23 ENCOUNTER — Encounter: Payer: Self-pay | Admitting: Neurology

## 2017-11-23 ENCOUNTER — Ambulatory Visit (INDEPENDENT_AMBULATORY_CARE_PROVIDER_SITE_OTHER): Payer: Medicare Other | Admitting: Neurology

## 2017-11-23 VITALS — BP 110/80 | HR 81 | Ht 62.0 in | Wt 161.4 lb

## 2017-11-23 DIAGNOSIS — R404 Transient alteration of awareness: Secondary | ICD-10-CM

## 2017-11-23 DIAGNOSIS — R443 Hallucinations, unspecified: Secondary | ICD-10-CM | POA: Diagnosis not present

## 2017-11-23 NOTE — Patient Instructions (Addendum)
1. Schedule open MRI brain with and without contrast  We have sent a referral for your OPEN MRI to [facility].  They will contact you directly to schedule your appointment.  [facility] is located at [address] [place], [place] [address].  If you need to speak with [facility] for any reason, they can be reached at [phone]  2. Schedule 1-hour EEG, if normal we will do a 48-hour EEG 3. If these tests are normal, we will schedule Neurocognitive testing  4. Follow-up after tests

## 2017-11-23 NOTE — Progress Notes (Addendum)
NEUROLOGY CONSULTATION NOTE  Laurie Dunn MRN: [id number] DOB: 1946/01/09  Referring provider: Dr. [name] Primary care provider: Dr. [name]  Reason for consult:  Cognitive changes  Dear Dr Emmaline Life:  Thank you for your kind referral of Laurie Dunn for consultation of the above symptoms. Although her history is well known to you, please allow me to reiterate it for the purpose of our medical record. Records and images were personally reviewed where available.  HISTORY OF PRESENT ILLNESS: This is a pleasant 72 year old right-handed woman with a history of hypertension, GERD, presenting for evaluation of symptoms suggestive of visual agnosia that started 3-4 months ago. She describes it as "seeing something and my mind identifies it as wrong." It does not happen all the time. What alarmed her is the certainty she has of what the object is, then she has to convince herself that it is not that object. They mostly occur in twilight hours or in darkly lit rooms, she went into a darkly lit bathroom and saw a peach pit on the counter. She thought why would she have a peach pit, then realized it was a makeup brush. Another time she was looking out in the yard and saw what she thought was a Dispensing optician by the tree. She thought it could not be that and ran through a list of what it might be, then on further investigation she realized it was a piece of wood. She had her vision checked and got a new prescription, but no significant eye issues. She also relates an incident a couple of months ago while sitting in the living room watching TV at night, there was a hallway to the right and she was certain she saw her husband and heard a voice say something, but she could not hear it over the TV, and when she looked up he was not there. For the past 2-3 months, she has had episodes where she smells a sweet pleasant smell for a few minutes, occurring every couple of weeks. She relates unusual  episodes that started 5 years ago, she recalls getting really sleepy one day, she lay down, and "came to" a week or 2 later. She felt like she was slowly coming out of anesthesia and her memory was slowly starting to come back. She reports she was in bed the whole time, "like I was constantly sleeping," then that her husband would wake her up and feed her or bring her to the bathroom. Her husband did not seek medical attention when this episode occurred. A couple of years ago, she was getting ready to take her husband to [place] and he told her to stay home because the day prior she slept all day. She does not recall sleeping all day at all. She otherwise denies any staring/unresponsive episodes, other gaps in time, rising epigastric sensation, focal numbness/tingling/weakness, myoclonic jerks. She denies any headaches, dizziness, diplopia, dysarthria/dysphagia, bowel/bladder dysfunction, anosmia. The other day while driving she noticed her left thumb was twitching, even after she stopped driving it was still "vibrating." She has occasional right shoulder pain, no back pain. She has been more forgetful, going to a store and coming home realizing she did not buy what she intended to get. She denies getting lost driving, no missed medications. Her husband is in charge of bills. No family history of similar symptoms. She had previously been drinking 2-3 beers daily, she had cut down to a glass of wine 2-3 times a week. No history  of significant head injuries. 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 She notes daytime drowsiness, she can fall asleep easily in a quiet room. No cataplexy.  PAST MEDICAL HISTORY: Past Medical History:  Diagnosis Date  . GERD (gastroesophageal reflux disease)   . Hypertension   . Mild emphysema ([place])     PAST SURGICAL HISTORY: Past Surgical History:  Procedure Laterality Date  . DILATION AND CURETTAGE OF UTERUS  1981  . oopherrectomy    .  TONSILLECTOMY  1953    MEDICATIONS: Current Outpatient Medications on File Prior to Visit  Medication Sig Dispense Refill  . amLODipine (NORVASC) 5 MG tablet Take 1 tablet (5 mg total) by mouth daily. 90 tablet 3  . calcium citrate-vitamin D 500-500 MG-UNIT chewable tablet Chew 2 tablets by mouth daily. 60 tablet 6  . cetirizine (ZYRTEC) 10 MG tablet Take 1 tablet (10 mg total) by mouth daily. 30 tablet 5  . fluticasone (FLONASE) 50 MCG/ACT nasal spray Place 1 spray into both nostrils daily. 1 spray in each nostril every day 16 g 5  . lisinopril (PRINIVIL,ZESTRIL) 40 MG tablet Take 1 tablet (40 mg total) by mouth daily. PATIENT NEEDS OFFICE VISIT FOR ADDITIONAL REFILLS 90 tablet 3  . Omega-3 Fatty Acids (FISH OIL PO) Take by mouth.    . [DISCONTINUED] famotidine (PEPCID) 20 MG tablet Take 1 tablet (20 mg total) by mouth 2 (two) times daily. X 5 days 10 tablet 0   No current facility-administered medications on file prior to visit.     ALLERGIES: No Known Allergies  FAMILY HISTORY: Family History  Problem Relation Age of Onset  . Cancer Mother        Kidney and Stomach   . Cancer Father        Brain Cancer   . Cancer Sister        Lung cancer   . Hypertension Brother     SOCIAL HISTORY: Social History   Socioeconomic History  . Marital status: Married    Spouse name: Not on file  . Number of children: Not on file  . Years of education: Not on file  . Highest education level: Not on file  Occupational History  . Not on file  Social Needs  . Financial resource strain: Not on file  . Food insecurity:    Worry: Not on file    Inability: Not on file  . Transportation needs:    Medical: Not on file    Non-medical: Not on file  Tobacco Use  . Smoking status: Former Smoker    Types: Cigarettes    Last attempt to quit: 08/24/2012    Years since quitting: 5.2  . Smokeless tobacco: Never Used  Substance and Sexual Activity  . Alcohol use: Yes    Alcohol/week: 9.0 - 18.0  oz    Types: 15 - 30 Cans of beer per week    Comment: Per day drinks 3 or 4 beers   . Drug use: No  . Sexual activity: Never  Lifestyle  . Physical activity:    Days per week: Not on file    Minutes per session: Not on file  . Stress: Not on file  Relationships  . Social connections:    Talks on phone: Not on file    Gets together: Not on file    Attends religious service: Not on file    Active member of club or organization: Not on file    Attends meetings  of clubs or organizations: Not on file    Relationship status: Not on file  . Intimate partner violence:    Fear of current or ex partner: Not on file    Emotionally abused: Not on file    Physically abused: Not on file    Forced sexual activity: Not on file  Other Topics Concern  . 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, [employer]     REVIEW OF SYSTEMS: Constitutional: No fevers, chills, or sweats, no generalized fatigue, change in appetite Eyes: No visual changes, double vision, eye pain Ear, nose and throat: No hearing loss, ear pain, nasal congestion, sore throat Cardiovascular: No chest pain, palpitations Respiratory:  No shortness of breath at rest or with exertion, wheezes GastrointestinaI: No nausea, vomiting, diarrhea, abdominal pain, fecal incontinence Genitourinary:  No dysuria, urinary retention or frequency Musculoskeletal:  No neck pain, back pain Integumentary: No rash, pruritus, skin lesions Neurological: as above Psychiatric: No depression, insomnia, anxiety Endocrine: No palpitations, fatigue, diaphoresis, mood swings, change in appetite, change in weight, increased thirst Hematologic/Lymphatic:  No anemia, purpura, petechiae. Allergic/Immunologic: no itchy/runny eyes, nasal congestion, recent allergic reactions, rashes  PHYSICAL EXAM: Vitals:   11/23/17 1029  BP: 110/80  Pulse: 81  SpO2: 98%   General: No acute  distress Head:  Normocephalic/atraumatic Eyes: Fundoscopic exam shows bilateral sharp discs, no vessel changes, exudates, or hemorrhages Neck: supple, no paraspinal tenderness, full range of motion Back: No paraspinal tenderness Heart: regular rate and rhythm Lungs: Clear to auscultation bilaterally. Vascular: No carotid bruits. Skin/Extremities: No rash, no edema Neurological Exam: Mental status: alert and oriented to person, place, and time, no dysarthria or aphasia, Fund of knowledge is appropriate.  Recent and remote memory are intact.  Attention and concentration are normal.    Able to name objects and repeat phrases. CDT 5/5. Names 19 words starting with F in 1 minute (nl >11) MMSE - Mini Mental State Exam 11/23/2017  Orientation to time 5  Orientation to Place 5  Registration 3  Attention/ Calculation 5  Recall 3  Language- name 2 objects 2  Language- repeat 1  Language- follow 3 step command 3  Language- read & follow direction 1  Write a sentence 1  Copy design 1  Total score 30   Cranial nerves: CN I: not tested CN II: pupils equal, round and reactive to light, visual fields intact, fundi unremarkable. CN III, IV, VI:  full range of motion, no nystagmus, no ptosis CN V: decreased cold and pin on left V1-3, did not split midline with pin CN VII: upper and lower face symmetric CN VIII: hearing intact to finger rub CN IX, X: gag intact, uvula midline CN XI: sternocleidomastoid and trapezius muscles intact CN XII: tongue midline Bulk & Tone: normal, no fasciculations. Motor: 5/5 throughout with no pronator drift. Sensation: decreased cold on left UE and LE, decreased pin on right UE and LE, intact vibration and JPS. Romberg test negative Deep Tendon Reflexes: +2 throughout except for +1 ankles bilaterally, no ankle clonus Plantar responses: downgoing bilaterally Cerebellar: no incoordination on finger to nose testing Gait: narrow-based and steady, able to tandem walk  adequately. Tremor: none  IMPRESSION: This is a pleasant 72 year old right-handed woman with a history of hypertension, GERD, presenting with an interesting constellation of symptoms, including visual agnosia, olfactory hallucinations, one episode of auditory hallucination, episodes where she is described to be "sleeping" that she  is amnestic of (her husband is not present to provide additional history of this). Etiology of symptoms is unclear. MRI brain with and without contrast will be ordered to assess for underlying structural abnormality, with close attention to the visual association cortex. Seizures are considered, however the prolonged episode (1-2 weeks 5 years ago) is unusual. A 1-hour EEG will be ordered, and if normal, a 48-hour EEG to further classify her symptoms will be done. If these tests are normal, we will plan to do Neurocognitive testing to evaluate for a neurodegenerative condition such as Lewy Body dementia where fluctuating cognition and visual hallucinations are seen. Her MMSE today is normal 30/30. Narcolepsy can present with hypnopompic/hypnogogic hallucinations and daytime drowsiness, however this is an unusual age to start. She will follow-up after the tests.  Thank you for allowing me to participate in the care of this patient. Please do not hesitate to call for any questions or concerns.   Ellouise Newer, M.D.  CC: Dr. Emmaline Life

## 2017-11-25 ENCOUNTER — Ambulatory Visit (INDEPENDENT_AMBULATORY_CARE_PROVIDER_SITE_OTHER): Payer: Medicare Other | Admitting: Neurology

## 2017-11-25 DIAGNOSIS — R443 Hallucinations, unspecified: Secondary | ICD-10-CM

## 2017-11-25 DIAGNOSIS — R404 Transient alteration of awareness: Secondary | ICD-10-CM

## 2017-12-06 NOTE — Procedures (Signed)
ELECTROENCEPHALOGRAM REPORT  Date of Study: 11/25/2017  Patient's Name: Laurie Dunn MRN: [id number] Date of Birth: 1945-12-24  Referring Provider: Dr. Ellouise Newer  Clinical History: This is a 72 year old woman with episodes where she is described to be "sleeping" that she is amnestic of. She also reports olfactory and auditory hallucinations.   Medications: NORVASC 5 MG tablet  calcium citrate-vitamin D 500-500 MG-UNIT chewable tablet  ZYRTEC 10 MG tablet  FLONASE 50 MCG/ACT nasal spray  PRINIVIL,ZESTRIL 40 MG tablet  FISH OIL PO  Technical Summary: A multichannel digital 1-hour EEG recording measured by the international 10-20 system with electrodes applied with paste and impedances below 5000 ohms performed in our laboratory with EKG monitoring in an awake and drowsy patient.  Hyperventilation and photic stimulation were performed.  The digital EEG was referentially recorded, reformatted, and digitally filtered in a variety of bipolar and referential montages for optimal display.    Description: The patient is awake and drowsy during the recording.  During maximal wakefulness, there is a symmetric, medium voltage 9-9.5 Hz posterior dominant rhythm that attenuates with eye opening.  The record is symmetric.  During drowsiness, there is an increase in theta slowing of the background, with shifting asymmetry over the bilateral temporal regions, at times sharply contoured without clear epileptogenic potential. Deeper stages of sleep were not captured. Hyperventilation and photic stimulation did not elicit any abnormalities.  There were no clear epileptiform discharges or electrographic seizures seen.    EKG lead showed sinus bradycardia at 54 bpm.  Impression: This 1-hour awake and drowsy EEG is within normal limits for age.  Clinical Correlation: A normal EEG does not exclude a clinical diagnosis of epilepsy.  If further clinical questions remain, prolonged EEG may be helpful.   Clinical correlation is advised.   Ellouise Newer, M.D.

## 2017-12-09 ENCOUNTER — Encounter: Payer: Self-pay | Admitting: Neurology

## 2017-12-09 DIAGNOSIS — R443 Hallucinations, unspecified: Secondary | ICD-10-CM | POA: Diagnosis not present

## 2017-12-15 ENCOUNTER — Telehealth: Payer: Self-pay | Admitting: Neurology

## 2017-12-15 NOTE — Telephone Encounter (Signed)
MRI brain with and without contrast done 12/09/17 showed a small focus of increased T2 signal in the cortex of the medial inferior left temporal lobe, possibly prior ischemia. Compared to MRI brain in 2016 reported as normal, it is visualized and unchanged from prior scan. No acute changes.

## 2017-12-28 ENCOUNTER — Telehealth: Payer: Self-pay | Admitting: Neurology

## 2017-12-28 NOTE — Telephone Encounter (Signed)
Patient called needing to check the Status of her MRI results. Please Call. Thanks

## 2017-12-29 ENCOUNTER — Telehealth: Payer: Self-pay

## 2017-12-29 NOTE — Telephone Encounter (Signed)
-----   Message from Cameron Sprang, MD sent at 12/15/2017  3:56 PM EDT ----- Regarding: MRI results Pls let her know the MRI brain did not show any changes from her prior MRI in 2016. Would proceed with 48-hour EEG as we had discussed to see what the brain waves show when she is having these episodes. Thanks

## 2017-12-29 NOTE — Telephone Encounter (Signed)
LMOM relaying MRI results.

## 2017-12-29 NOTE — Telephone Encounter (Signed)
LMOM relaying message below.  

## 2018-05-19 ENCOUNTER — Other Ambulatory Visit: Payer: Self-pay | Admitting: Internal Medicine

## 2018-05-19 DIAGNOSIS — I10 Essential (primary) hypertension: Secondary | ICD-10-CM

## 2018-05-23 ENCOUNTER — Encounter (HOSPITAL_COMMUNITY): Payer: Self-pay | Admitting: Emergency Medicine

## 2018-05-23 ENCOUNTER — Ambulatory Visit (HOSPITAL_COMMUNITY)
Admission: EM | Admit: 2018-05-23 | Discharge: 2018-05-23 | Disposition: A | Discharge status: Home / Self Care | Payer: Medicare Other | Attending: Family Medicine | Admitting: Family Medicine

## 2018-05-23 ENCOUNTER — Ambulatory Visit (INDEPENDENT_AMBULATORY_CARE_PROVIDER_SITE_OTHER): Payer: Medicare Other

## 2018-05-23 DIAGNOSIS — S42202A Unspecified fracture of upper end of left humerus, initial encounter for closed fracture: Secondary | ICD-10-CM | POA: Diagnosis not present

## 2018-05-23 DIAGNOSIS — S42352A Displaced comminuted fracture of shaft of humerus, left arm, initial encounter for closed fracture: Secondary | ICD-10-CM | POA: Diagnosis not present

## 2018-05-23 DIAGNOSIS — W010XXA Fall on same level from slipping, tripping and stumbling without subsequent striking against object, initial encounter: Secondary | ICD-10-CM | POA: Diagnosis not present

## 2018-05-23 MED ORDER — HYDROCODONE-ACETAMINOPHEN 5-325 MG PO TABS: 1.0000 | ORAL_TABLET | Freq: Four times a day (QID) | ORAL | 0 refills | Status: DC | PRN

## 2018-05-23 MED ORDER — HYDROCODONE-ACETAMINOPHEN 5-325 MG PO TABS
ORAL_TABLET | ORAL | Status: AC
  Filled 2018-05-23: qty 1

## 2018-05-23 MED ORDER — HYDROCODONE-ACETAMINOPHEN 5-325 MG PO TABS
1.0000 | ORAL_TABLET | Freq: Once | ORAL | Status: AC
  Administered 2018-05-23: 1 via ORAL

## 2018-05-23 NOTE — Discharge Instructions (Signed)
I have spoken with the on-call physician Dr. Ophelia Charter.  He will be in clinic tomorrow and will see you at 0800. Since it is after hours you don't have a specific appointment so if there confusion let the desk know that the Urgent Care provider spoke with him last night and said he will be seeing you in clinic.  Immobilize the arm with use of the sling provided.  Hydrocodone for pain. Be careful as this may cause drowsiness. Ice application can be helpful as well.

## 2018-05-23 NOTE — ED Triage Notes (Signed)
PT tripped over her dog and fell onto left shoulder. Did not strike her head. No LOC

## 2018-05-23 NOTE — ED Provider Notes (Signed)
[place]    CSN: [id number] Arrival date & time: 05/23/18  1600     History   Chief Complaint Chief Complaint  Patient presents with  . Fall    HPI Laurie Dunn is a 72 y.o. female.   [name] presents with complaints of left shoulder pain after a fall today at 1600. She was stepping over her dog when it caused her to slip and fall, landing directly on her left shoulder. Pain since. Pain with any movement. Pain 10/10 with movement. Mild pain when at rest. Feels best when held against her. Has not taken any medications for pain. Denies any previous shoulder injury or surgery. No numbness or tingling, although feels like near her deltoid her sensation is slightly decreased. Hx of htn, gerd.      ROS per HPI.      Past Medical History:  Diagnosis Date  . GERD (gastroesophageal reflux disease)   . Hypertension   . Mild emphysema [facility])     Patient Active Problem List   Diagnosis Date Noted  . Post-viral cough syndrome 10/05/2017  . Cognitive change 08/20/2017  . Osteoporosis 05/03/2017  . Alcohol abuse 04/28/2017  . 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 Hypertension 04/02/2017  . Healthcare maintenance 04/02/2017  . Former smoker 04/02/2017    Past Surgical History:  Procedure Laterality Date  . DILATION AND CURETTAGE OF UTERUS  1981  . oopherrectomy    . TONSILLECTOMY  1953    OB History   None      Home Medications    Prior to Admission medications   Medication Sig Start Date End Date Taking? Authorizing Provider  amLODipine (NORVASC) 5 MG tablet take 1 tablet by mouth daily 05/20/18  Yes Caroline More, DO  calcium citrate-vitamin D 500-500 MG-UNIT chewable tablet Chew 2 tablets by mouth daily. 05/04/17  Yes Mikell, Jeani Sow, MD  lisinopril (PRINIVIL,ZESTRIL) 40 MG tablet take 1 tablet by mouth daily *patient needs office visit for refills* 05/20/18  Yes Tammi Klippel, Sherin, DO  ranitidine (ZANTAC) 75 MG tablet Take 75 mg by mouth 2  (two) times daily.   Yes [provider]  cetirizine (ZYRTEC) 10 MG tablet Take 1 tablet (10 mg total) by mouth daily. 10/05/17   Mikell, Jeani Sow, MD  fluticasone (FLONASE) 50 MCG/ACT nasal spray Place 1 spray into both nostrils daily. 1 spray in each nostril every day 10/05/17   Tonette Bihari, MD  HYDROcodone-acetaminophen (NORCO/VICODIN) 5-325 MG tablet Take 1 tablet by mouth every 6 (six) hours as needed. 05/23/18   Zigmund Gottron, NP  Omega-3 Fatty Acids (FISH OIL PO) Take by mouth.    [provider]  famotidine (PEPCID) 20 MG tablet Take 1 tablet (20 mg total) by mouth 2 (two) times daily. X 5 days 12/01/14 12/10/14  Presson, Audelia Hives, PA    Family History Family History  Problem Relation Age of Onset  . Cancer Mother        Kidney and Stomach   . Cancer Father        Brain Cancer   . Cancer Sister        Lung cancer   . Hypertension Brother     Social History Social History   Tobacco Use  . Smoking status: Former Smoker    Types: Cigarettes    Last attempt to quit: 08/24/2012    Years since quitting: 5.7  . Smokeless tobacco: Never Used  Substance Use Topics  . Alcohol use: Yes  Alcohol/week: 15.0 - 30.0 standard drinks    Types: 15 - 30 Cans of beer per week    Comment: Per day drinks 3 or 4 beers   . Drug use: No     Allergies   Patient has no known allergies.   Review of Systems Review of Systems   Physical Exam Triage Vital Signs ED Triage Vitals [05/23/18 1721]  Enc Vitals Group     BP (!) 179/77     Pulse Rate 76     Resp 16     Temp 98.3 F (36.8 C)     Temp Source Oral     SpO2 100 %     Weight      Height      Head Circumference      Peak Flow      Pain Score 5     Pain Loc      Pain Edu?      Excl. in [place]?    No data found.  Updated Vital Signs BP (!) 179/77   Pulse 76   Temp 98.3 F (36.8 C) (Oral)   Resp 16   SpO2 100%   Visual Acuity Right Eye Distance:   Left Eye Distance:   Bilateral  Distance:    Right Eye Near:   Left Eye Near:    Bilateral Near:     Physical Exam  Constitutional: She is oriented to person, place, and time. She appears well-developed and well-nourished. No distress.  Cardiovascular: Normal rate, regular rhythm and normal heart sounds.  Pulmonary/Chest: Effort normal and breath sounds normal.  Musculoskeletal:       Left shoulder: She exhibits decreased range of motion, tenderness, bony tenderness, swelling, pain and decreased strength. She exhibits no effusion, no laceration, no spasm and normal pulse.  Tenderness primarily to proximal humerus on palpation; significantly limited rom, pain with external rotation; pain to shoulder with elbow ROm but no elbow pain; no pain to left wrist; cap refill < 2 seconds ; strong radial pulse   Neurological: She is alert and oriented to person, place, and time.  Skin: Skin is warm and dry.     UC Treatments / Results  Labs (all labs ordered are listed, but only abnormal results are displayed) Labs Reviewed - No data to display  EKG None  Radiology Dg Shoulder Left  Result Date: 05/23/2018 CLINICAL DATA:  Patient states that she fell today when she tripped over her dog, pain along left humeral head and into shoulder joint. No Hx EXAM: LEFT SHOULDER - 2+ VIEW COMPARISON:  None. FINDINGS: There is a comminuted fracture of the LEFT humeral head and neck. There is a discrete greater tuberosity fragment. No evidence for dislocation. The LEFT lung apex is unremarkable. IMPRESSION: Comminuted fracture of the LEFT humeral head and neck. Electronically Signed   By: Nolon Nations M.D.   On: 05/23/2018 17:56    Procedures Procedures (including critical care time)  Medications Ordered in UC Medications  HYDROcodone-acetaminophen (NORCO/VICODIN) 5-325 MG per tablet 1 tablet (1 tablet Oral Given 05/23/18 1822)    Initial Impression / Assessment and Plan / UC Course  I have reviewed the triage vital signs and the  nursing notes.  Pertinent labs & imaging results that were available during my care of the patient were reviewed by me and considered in my medical decision making (see chart for details).    Humeral head fracture s/p fall today. Received return call from Dr. Griffin Basil, he will  see patient in clinic at 0800 tomorrow morning. norco for pain, sling immobilizer. Patient verbalized understanding and agreeable to plan.      1813: Voicemail left for on call physcian Dr. Griffin Basil  Final Clinical Impressions(s) / UC Diagnoses   Final diagnoses:  Closed fracture of proximal end of left humerus, unspecified fracture morphology, initial encounter     Discharge Instructions     I have spoken with the on-call physician Dr. Ophelia Charter.  He will be in clinic tomorrow and will see you at 0800. Since it is after hours you don't have a specific appointment so if there confusion let the desk know that the Urgent Care provider spoke with him last night and said he will be seeing you in clinic.  Immobilize the arm with use of the sling provided.  Hydrocodone for pain. Be careful as this may cause drowsiness. Ice application can be helpful as well.     ED Prescriptions    Medication Sig Dispense Auth. Provider   HYDROcodone-acetaminophen (NORCO/VICODIN) 5-325 MG tablet Take 1 tablet by mouth every 6 (six) hours as needed. 10 tablet Zigmund Gottron, NP     Controlled Substance Prescriptions [place] Controlled Substance Registry consulted? Not Applicable   Zigmund Gottron, NP 05/23/18 1836

## 2018-05-24 ENCOUNTER — Other Ambulatory Visit: Payer: Self-pay | Admitting: Orthopaedic Surgery

## 2018-05-24 ENCOUNTER — Ambulatory Visit
Admission: RE | Admit: 2018-05-24 | Discharge: 2018-05-24 | Disposition: A | Discharge status: Home / Self Care | Payer: Medicare Other | Source: Ambulatory Visit | Attending: Orthopaedic Surgery | Admitting: Orthopaedic Surgery

## 2018-05-24 ENCOUNTER — Ambulatory Visit: Admission: RE | Admit: 2018-05-24 | Payer: Medicare Other | Source: Ambulatory Visit

## 2018-05-24 DIAGNOSIS — R937 Abnormal findings on diagnostic imaging of other parts of musculoskeletal system: Secondary | ICD-10-CM | POA: Diagnosis not present

## 2018-05-24 DIAGNOSIS — M25512 Pain in left shoulder: Secondary | ICD-10-CM

## 2018-05-24 DIAGNOSIS — S42212A Unspecified displaced fracture of surgical neck of left humerus, initial encounter for closed fracture: Secondary | ICD-10-CM | POA: Diagnosis not present

## 2018-05-24 DIAGNOSIS — S42295A Other nondisplaced fracture of upper end of left humerus, initial encounter for closed fracture: Secondary | ICD-10-CM | POA: Diagnosis not present

## 2018-05-30 DIAGNOSIS — G8918 Other acute postprocedural pain: Secondary | ICD-10-CM | POA: Diagnosis not present

## 2018-05-30 DIAGNOSIS — X58XXXA Exposure to other specified factors, initial encounter: Secondary | ICD-10-CM | POA: Diagnosis not present

## 2018-05-30 DIAGNOSIS — S42252A Displaced fracture of greater tuberosity of left humerus, initial encounter for closed fracture: Secondary | ICD-10-CM | POA: Diagnosis not present

## 2018-05-30 DIAGNOSIS — S42202A Unspecified fracture of upper end of left humerus, initial encounter for closed fracture: Secondary | ICD-10-CM | POA: Diagnosis not present

## 2018-05-30 DIAGNOSIS — Y999 Unspecified external cause status: Secondary | ICD-10-CM | POA: Diagnosis not present

## 2018-06-01 ENCOUNTER — Inpatient Hospital Stay: Admit: 2018-06-01 | Payer: Medicare Other | Admitting: Orthopaedic Surgery

## 2018-06-01 SURGERY — ARTHROPLASTY, SHOULDER, TOTAL, REVERSE
Anesthesia: Choice | Laterality: Left

## 2018-06-07 DIAGNOSIS — S42202D Unspecified fracture of upper end of left humerus, subsequent encounter for fracture with routine healing: Secondary | ICD-10-CM | POA: Diagnosis not present

## 2018-06-10 ENCOUNTER — Ambulatory Visit: Payer: Medicare Other | Admitting: Family Medicine

## 2018-06-16 DIAGNOSIS — M25612 Stiffness of left shoulder, not elsewhere classified: Secondary | ICD-10-CM | POA: Diagnosis not present

## 2018-06-16 DIAGNOSIS — M6281 Muscle weakness (generalized): Secondary | ICD-10-CM | POA: Diagnosis not present

## 2018-06-16 DIAGNOSIS — S42252D Displaced fracture of greater tuberosity of left humerus, subsequent encounter for fracture with routine healing: Secondary | ICD-10-CM | POA: Diagnosis not present

## 2018-06-16 DIAGNOSIS — S42292D Other displaced fracture of upper end of left humerus, subsequent encounter for fracture with routine healing: Secondary | ICD-10-CM | POA: Diagnosis not present

## 2018-06-21 DIAGNOSIS — M6281 Muscle weakness (generalized): Secondary | ICD-10-CM | POA: Diagnosis not present

## 2018-06-21 DIAGNOSIS — M25612 Stiffness of left shoulder, not elsewhere classified: Secondary | ICD-10-CM | POA: Diagnosis not present

## 2018-06-21 DIAGNOSIS — S42292D Other displaced fracture of upper end of left humerus, subsequent encounter for fracture with routine healing: Secondary | ICD-10-CM | POA: Diagnosis not present

## 2018-06-21 DIAGNOSIS — S42252D Displaced fracture of greater tuberosity of left humerus, subsequent encounter for fracture with routine healing: Secondary | ICD-10-CM | POA: Diagnosis not present

## 2018-06-23 DIAGNOSIS — S42252D Displaced fracture of greater tuberosity of left humerus, subsequent encounter for fracture with routine healing: Secondary | ICD-10-CM | POA: Diagnosis not present

## 2018-06-23 DIAGNOSIS — M6281 Muscle weakness (generalized): Secondary | ICD-10-CM | POA: Diagnosis not present

## 2018-06-23 DIAGNOSIS — M25612 Stiffness of left shoulder, not elsewhere classified: Secondary | ICD-10-CM | POA: Diagnosis not present

## 2018-06-23 DIAGNOSIS — S42292D Other displaced fracture of upper end of left humerus, subsequent encounter for fracture with routine healing: Secondary | ICD-10-CM | POA: Diagnosis not present

## 2018-06-30 DIAGNOSIS — M25612 Stiffness of left shoulder, not elsewhere classified: Secondary | ICD-10-CM | POA: Diagnosis not present

## 2018-06-30 DIAGNOSIS — M6281 Muscle weakness (generalized): Secondary | ICD-10-CM | POA: Diagnosis not present

## 2018-06-30 DIAGNOSIS — S42292D Other displaced fracture of upper end of left humerus, subsequent encounter for fracture with routine healing: Secondary | ICD-10-CM | POA: Diagnosis not present

## 2018-06-30 DIAGNOSIS — S42252D Displaced fracture of greater tuberosity of left humerus, subsequent encounter for fracture with routine healing: Secondary | ICD-10-CM | POA: Diagnosis not present

## 2018-07-04 DIAGNOSIS — M6281 Muscle weakness (generalized): Secondary | ICD-10-CM | POA: Diagnosis not present

## 2018-07-04 DIAGNOSIS — M25612 Stiffness of left shoulder, not elsewhere classified: Secondary | ICD-10-CM | POA: Diagnosis not present

## 2018-07-04 DIAGNOSIS — S42292D Other displaced fracture of upper end of left humerus, subsequent encounter for fracture with routine healing: Secondary | ICD-10-CM | POA: Diagnosis not present

## 2018-07-04 DIAGNOSIS — S42252D Displaced fracture of greater tuberosity of left humerus, subsequent encounter for fracture with routine healing: Secondary | ICD-10-CM | POA: Diagnosis not present

## 2018-07-07 DIAGNOSIS — S42252D Displaced fracture of greater tuberosity of left humerus, subsequent encounter for fracture with routine healing: Secondary | ICD-10-CM | POA: Diagnosis not present

## 2018-07-07 DIAGNOSIS — S42292D Other displaced fracture of upper end of left humerus, subsequent encounter for fracture with routine healing: Secondary | ICD-10-CM | POA: Diagnosis not present

## 2018-07-07 DIAGNOSIS — M25612 Stiffness of left shoulder, not elsewhere classified: Secondary | ICD-10-CM | POA: Diagnosis not present

## 2018-07-07 DIAGNOSIS — M6281 Muscle weakness (generalized): Secondary | ICD-10-CM | POA: Diagnosis not present

## 2018-07-11 DIAGNOSIS — S42292D Other displaced fracture of upper end of left humerus, subsequent encounter for fracture with routine healing: Secondary | ICD-10-CM | POA: Diagnosis not present

## 2018-07-11 DIAGNOSIS — S42252D Displaced fracture of greater tuberosity of left humerus, subsequent encounter for fracture with routine healing: Secondary | ICD-10-CM | POA: Diagnosis not present

## 2018-07-11 DIAGNOSIS — M25612 Stiffness of left shoulder, not elsewhere classified: Secondary | ICD-10-CM | POA: Diagnosis not present

## 2018-07-11 DIAGNOSIS — M6281 Muscle weakness (generalized): Secondary | ICD-10-CM | POA: Diagnosis not present

## 2018-07-12 DIAGNOSIS — S42252D Displaced fracture of greater tuberosity of left humerus, subsequent encounter for fracture with routine healing: Secondary | ICD-10-CM | POA: Diagnosis not present

## 2018-07-14 DIAGNOSIS — S42252D Displaced fracture of greater tuberosity of left humerus, subsequent encounter for fracture with routine healing: Secondary | ICD-10-CM | POA: Diagnosis not present

## 2018-07-14 DIAGNOSIS — S42292D Other displaced fracture of upper end of left humerus, subsequent encounter for fracture with routine healing: Secondary | ICD-10-CM | POA: Diagnosis not present

## 2018-07-14 DIAGNOSIS — M6281 Muscle weakness (generalized): Secondary | ICD-10-CM | POA: Diagnosis not present

## 2018-07-14 DIAGNOSIS — M25612 Stiffness of left shoulder, not elsewhere classified: Secondary | ICD-10-CM | POA: Diagnosis not present

## 2018-07-18 DIAGNOSIS — S42252D Displaced fracture of greater tuberosity of left humerus, subsequent encounter for fracture with routine healing: Secondary | ICD-10-CM | POA: Diagnosis not present

## 2018-07-18 DIAGNOSIS — S42292D Other displaced fracture of upper end of left humerus, subsequent encounter for fracture with routine healing: Secondary | ICD-10-CM | POA: Diagnosis not present

## 2018-07-18 DIAGNOSIS — M6281 Muscle weakness (generalized): Secondary | ICD-10-CM | POA: Diagnosis not present

## 2018-07-18 DIAGNOSIS — M25612 Stiffness of left shoulder, not elsewhere classified: Secondary | ICD-10-CM | POA: Diagnosis not present

## 2018-07-26 DIAGNOSIS — M25612 Stiffness of left shoulder, not elsewhere classified: Secondary | ICD-10-CM | POA: Diagnosis not present

## 2018-07-26 DIAGNOSIS — M6281 Muscle weakness (generalized): Secondary | ICD-10-CM | POA: Diagnosis not present

## 2018-07-26 DIAGNOSIS — S42292D Other displaced fracture of upper end of left humerus, subsequent encounter for fracture with routine healing: Secondary | ICD-10-CM | POA: Diagnosis not present

## 2018-07-26 DIAGNOSIS — S42252D Displaced fracture of greater tuberosity of left humerus, subsequent encounter for fracture with routine healing: Secondary | ICD-10-CM | POA: Diagnosis not present

## 2018-08-02 DIAGNOSIS — S42292D Other displaced fracture of upper end of left humerus, subsequent encounter for fracture with routine healing: Secondary | ICD-10-CM | POA: Diagnosis not present

## 2018-08-02 DIAGNOSIS — M6281 Muscle weakness (generalized): Secondary | ICD-10-CM | POA: Diagnosis not present

## 2018-08-02 DIAGNOSIS — M25612 Stiffness of left shoulder, not elsewhere classified: Secondary | ICD-10-CM | POA: Diagnosis not present

## 2018-08-02 DIAGNOSIS — S42252D Displaced fracture of greater tuberosity of left humerus, subsequent encounter for fracture with routine healing: Secondary | ICD-10-CM | POA: Diagnosis not present

## 2018-08-03 ENCOUNTER — Ambulatory Visit (INDEPENDENT_AMBULATORY_CARE_PROVIDER_SITE_OTHER): Payer: Medicare Other | Admitting: Family Medicine

## 2018-08-03 DIAGNOSIS — L240 Irritant contact dermatitis due to detergents: Secondary | ICD-10-CM | POA: Diagnosis not present

## 2018-08-03 DIAGNOSIS — L259 Unspecified contact dermatitis, unspecified cause: Secondary | ICD-10-CM | POA: Insufficient documentation

## 2018-08-03 DIAGNOSIS — Z23 Encounter for immunization: Secondary | ICD-10-CM

## 2018-08-03 MED ORDER — TRIAMCINOLONE ACETONIDE 0.1 % EX OINT: 1.0000 "application " | TOPICAL_OINTMENT | Freq: Two times a day (BID) | CUTANEOUS | 1 refills | Status: DC

## 2018-08-03 NOTE — Patient Instructions (Signed)
It was a pleasure seeing you today.   Today we discussed your rash  For your rash: please use triamcinolone ointment on the areas of rash twice a day x1 week. Test in a small area first to ensure no reaction. Also do not use the [place] cleaning products unless wearing gloves. If not improvement in 2 weeks please come back in.   Please follow up in 2 weeks if no improvement or sooner if symptoms persist or worsen. Please call the clinic immediately if you have any concerns.   Our clinic's number is [phone]. Please call with questions or concerns.   Thank you,  Caroline More, DO

## 2018-08-03 NOTE — Progress Notes (Signed)
   Subjective:    Patient ID: Laurie Dunn, female    DOB: 09-09-1945, 72 y.o.   MRN: [id number]   CC: skin cracking  HPI: Skin cracking Patient presenting today for concerns of cracked skin around her fingers.  States that this occurred months ago but does not remember exact date.  States that it is somewhat painful, like a paper cut.  Patient notes also thickened skin around her thumb.  States that symptoms began on her left hand but then transferred to her right hand which is now worse than her left.  States that after she stopped using her left hand is much (after a left shoulder surgery) she notes that skin improved.  Has tried all over-the-counter creams and ointments including Aquaphor.  Has been wearing gloves at home which seems to help some.  Also notes bleeding when skin cracks.  Denies any allergies or history of eczema or asthma as a child.  No family history of allergies, but sister does have psoriasis.  Patient notes no changes in detergents, cosmetics, but does note that she recently purchased a new [place] cleaning product for her stovetop around the time that skin began to change.  Does not use gloves when using this product.  She is right-handed.   Objective:  BP 130/80   Pulse 80   Temp 98.4 F (36.9 C)   Wt 163 lb 6.4 oz (74.1 kg)   BMI 29.89 kg/m  Vitals and nursing note reviewed  General: well nourished, in no acute distress HEENT: normocephalic, moist mucous membranes  Cardiac: regular rate Respiratory: no increased WOB, speaking full sentences  Extremities: no edema or cyanosis. Warm, well perfused  Skin:    Neuro: alert and oriented, no focal deficits   Assessment & Plan:    Contact dermatitis Skin changes likely related to contact dermatitis from new cleaning product.  Area appears to be only around fingers, area where she would have held cleaning product.  Does not use gloves while using product.  No signs of fever or toxic symptoms.  Unlikely  psoriasis given appearance and no other joint or fingernail complaints.  Will give trial of triamcinolone ointment as well as advised patient to use gloves when using cleaning product.  Encouraged follow-up in 2 weeks if no improvement.    Return in about 2 weeks (around 08/17/2018), or if symptoms worsen or fail to improve.   Caroline More, DO, PGY-2

## 2018-08-03 NOTE — Assessment & Plan Note (Signed)
Skin changes likely related to contact dermatitis from new cleaning product.  Area appears to be only around fingers, area where she would have held cleaning product.  Does not use gloves while using product.  No signs of fever or toxic symptoms.  Unlikely psoriasis given appearance and no other joint or fingernail complaints.  Will give trial of triamcinolone ointment as well as advised patient to use gloves when using cleaning product.  Encouraged follow-up in 2 weeks if no improvement.

## 2018-08-04 DIAGNOSIS — M6281 Muscle weakness (generalized): Secondary | ICD-10-CM | POA: Diagnosis not present

## 2018-08-04 DIAGNOSIS — S42252D Displaced fracture of greater tuberosity of left humerus, subsequent encounter for fracture with routine healing: Secondary | ICD-10-CM | POA: Diagnosis not present

## 2018-08-04 DIAGNOSIS — S42292D Other displaced fracture of upper end of left humerus, subsequent encounter for fracture with routine healing: Secondary | ICD-10-CM | POA: Diagnosis not present

## 2018-08-04 DIAGNOSIS — M25612 Stiffness of left shoulder, not elsewhere classified: Secondary | ICD-10-CM | POA: Diagnosis not present

## 2018-08-09 DIAGNOSIS — M6281 Muscle weakness (generalized): Secondary | ICD-10-CM | POA: Diagnosis not present

## 2018-08-09 DIAGNOSIS — M25612 Stiffness of left shoulder, not elsewhere classified: Secondary | ICD-10-CM | POA: Diagnosis not present

## 2018-08-09 DIAGNOSIS — S42252D Displaced fracture of greater tuberosity of left humerus, subsequent encounter for fracture with routine healing: Secondary | ICD-10-CM | POA: Diagnosis not present

## 2018-08-09 DIAGNOSIS — S42292D Other displaced fracture of upper end of left humerus, subsequent encounter for fracture with routine healing: Secondary | ICD-10-CM | POA: Diagnosis not present

## 2018-08-11 DIAGNOSIS — M6281 Muscle weakness (generalized): Secondary | ICD-10-CM | POA: Diagnosis not present

## 2018-08-11 DIAGNOSIS — S42292D Other displaced fracture of upper end of left humerus, subsequent encounter for fracture with routine healing: Secondary | ICD-10-CM | POA: Diagnosis not present

## 2018-08-11 DIAGNOSIS — M25612 Stiffness of left shoulder, not elsewhere classified: Secondary | ICD-10-CM | POA: Diagnosis not present

## 2018-08-11 DIAGNOSIS — S42252D Displaced fracture of greater tuberosity of left humerus, subsequent encounter for fracture with routine healing: Secondary | ICD-10-CM | POA: Diagnosis not present

## 2018-08-30 DIAGNOSIS — M25612 Stiffness of left shoulder, not elsewhere classified: Secondary | ICD-10-CM | POA: Diagnosis not present

## 2018-09-06 DIAGNOSIS — M25612 Stiffness of left shoulder, not elsewhere classified: Secondary | ICD-10-CM | POA: Diagnosis not present

## 2018-09-06 DIAGNOSIS — S42252D Displaced fracture of greater tuberosity of left humerus, subsequent encounter for fracture with routine healing: Secondary | ICD-10-CM | POA: Diagnosis not present

## 2018-09-06 DIAGNOSIS — M6281 Muscle weakness (generalized): Secondary | ICD-10-CM | POA: Diagnosis not present

## 2018-09-06 DIAGNOSIS — S42292D Other displaced fracture of upper end of left humerus, subsequent encounter for fracture with routine healing: Secondary | ICD-10-CM | POA: Diagnosis not present

## 2018-09-08 DIAGNOSIS — S42292D Other displaced fracture of upper end of left humerus, subsequent encounter for fracture with routine healing: Secondary | ICD-10-CM | POA: Diagnosis not present

## 2018-09-08 DIAGNOSIS — M6281 Muscle weakness (generalized): Secondary | ICD-10-CM | POA: Diagnosis not present

## 2018-09-08 DIAGNOSIS — M25612 Stiffness of left shoulder, not elsewhere classified: Secondary | ICD-10-CM | POA: Diagnosis not present

## 2018-09-08 DIAGNOSIS — S42252D Displaced fracture of greater tuberosity of left humerus, subsequent encounter for fracture with routine healing: Secondary | ICD-10-CM | POA: Diagnosis not present

## 2018-09-10 IMAGING — DX DG HAND COMPLETE 3+V*R*
3 series · 3 of 3 positions shown · non-contrast
Comparison: None.

CLINICAL DATA: Injury to right hand from leash, with right ring
finger and fifth finger pain. Initial encounter.

EXAM:
RIGHT HAND - COMPLETE 3+ VIEW

[hand pa]
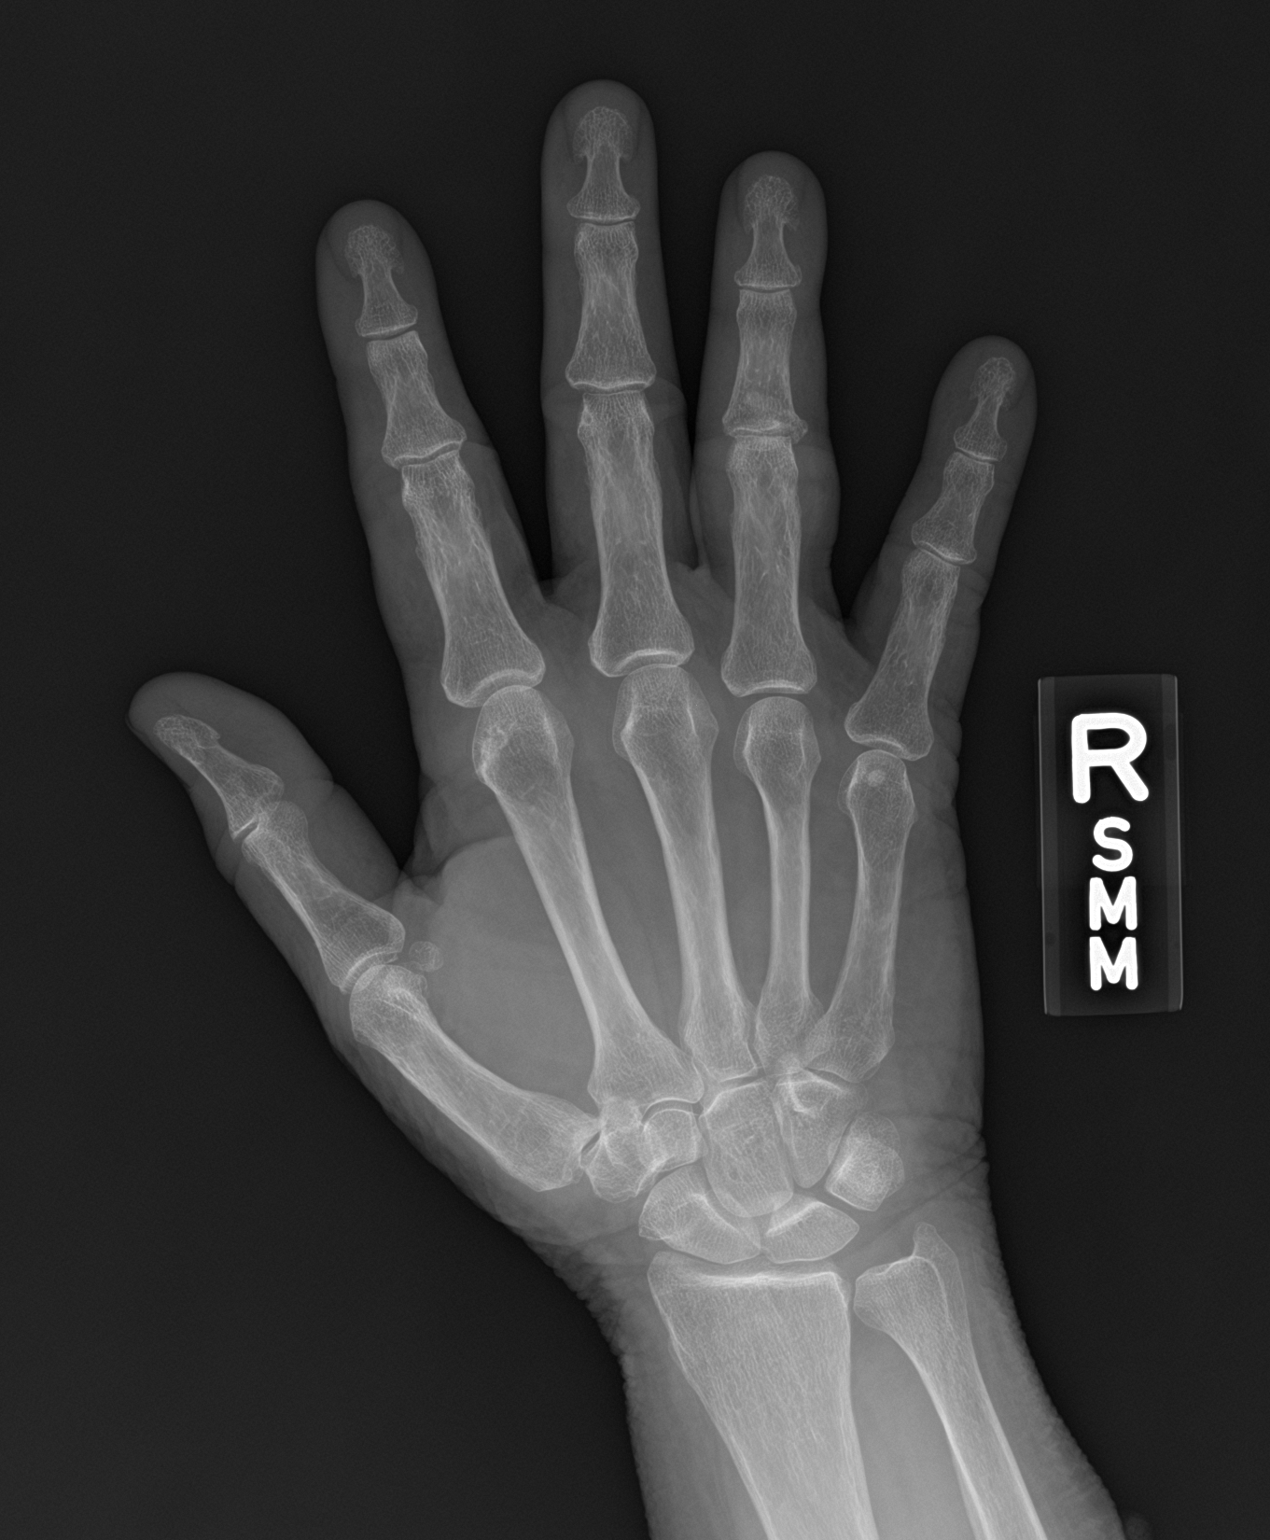

[hand obl]
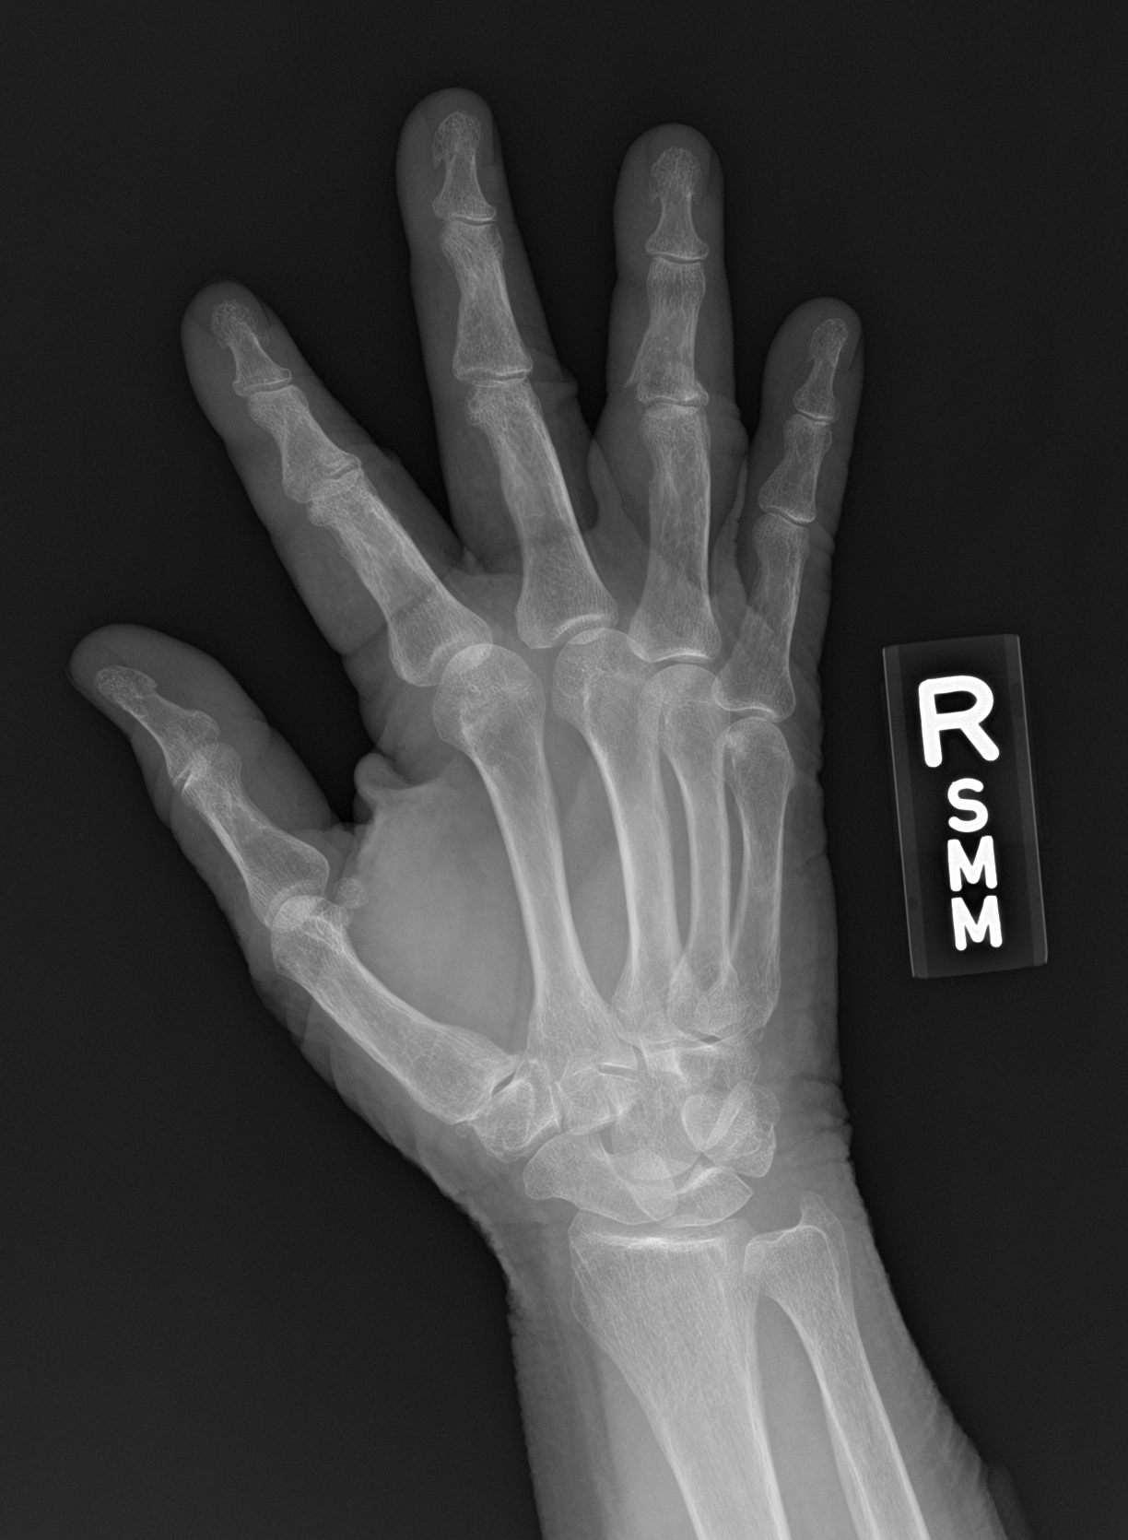

[hand lat]
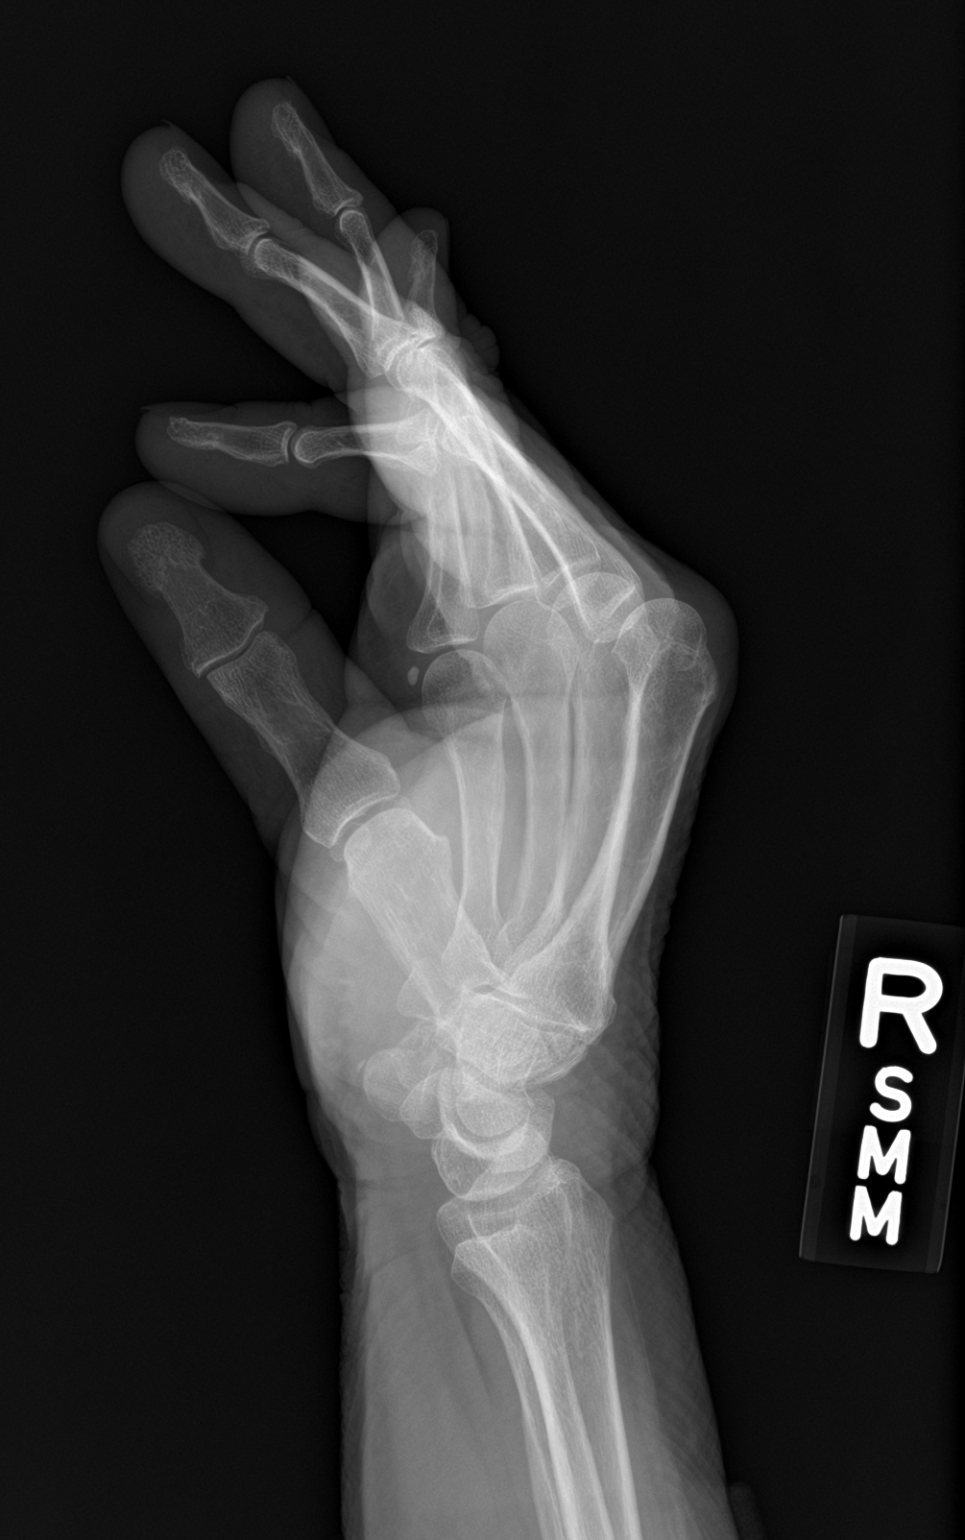

[3 of 3 positions shown; findings below may reference images not displayed]

FINDINGS: There is a mildly displaced fracture through the base of the fourth
middle phalanx, with mild volar and radial displacement, and
surrounding soft tissue swelling. There is likely extension to the
fourth proximal interphalangeal joint.

The carpal rows appear grossly intact, and demonstrate normal
alignment.
IMPRESSION: Mildly displaced fracture through the base of the fourth middle
phalanx, with mild volar and radial displacement. Fracture likely
extends to the fourth proximal interphalangeal joint.

## 2018-09-15 DIAGNOSIS — M6281 Muscle weakness (generalized): Secondary | ICD-10-CM | POA: Diagnosis not present

## 2018-09-15 DIAGNOSIS — S42252D Displaced fracture of greater tuberosity of left humerus, subsequent encounter for fracture with routine healing: Secondary | ICD-10-CM | POA: Diagnosis not present

## 2018-09-15 DIAGNOSIS — M25612 Stiffness of left shoulder, not elsewhere classified: Secondary | ICD-10-CM | POA: Diagnosis not present

## 2018-09-15 DIAGNOSIS — S42292D Other displaced fracture of upper end of left humerus, subsequent encounter for fracture with routine healing: Secondary | ICD-10-CM | POA: Diagnosis not present

## 2018-09-20 ENCOUNTER — Telehealth: Payer: Self-pay

## 2018-09-20 NOTE — Telephone Encounter (Signed)
Patient left message she is no longer able to find Zantac OTC. Pharmacist suggested sending a prescription for a similar med.  Danley Danker, RN [facility] RN)

## 2018-09-21 ENCOUNTER — Telehealth: Payer: Self-pay

## 2018-09-21 ENCOUNTER — Other Ambulatory Visit: Payer: Self-pay | Admitting: Family Medicine

## 2018-09-21 MED ORDER — RANITIDINE HCL 150 MG PO CAPS: 150.0000 mg | ORAL_CAPSULE | Freq: Every day | ORAL | 2 refills | Status: DC | PRN

## 2018-09-21 NOTE — Telephone Encounter (Signed)
Please inform patient that RX has been sent for [place], DO, PGY-2 [facility] 09/21/2018 4:33 PM

## 2018-09-21 NOTE — Telephone Encounter (Signed)
Informed patient of RX.  Marland KitchenOzella Almond, CMA

## 2018-10-24 ENCOUNTER — Ambulatory Visit (INDEPENDENT_AMBULATORY_CARE_PROVIDER_SITE_OTHER): Payer: Medicare Other

## 2018-10-24 DIAGNOSIS — Z23 Encounter for immunization: Secondary | ICD-10-CM

## 2018-10-24 NOTE — Progress Notes (Signed)
   Patient to nurse clinic for pneumonia vaccine. Pneumovax due per [facility]. Pneumovax given in right deltoid. Tolerated well.  Danley Danker, RN [facility] RN)

## 2019-03-06 ENCOUNTER — Other Ambulatory Visit: Payer: Self-pay | Admitting: Family Medicine

## 2019-03-06 DIAGNOSIS — I10 Essential (primary) hypertension: Secondary | ICD-10-CM

## 2019-03-23 ENCOUNTER — Other Ambulatory Visit: Payer: Self-pay

## 2019-05-11 DIAGNOSIS — Z23 Encounter for immunization: Secondary | ICD-10-CM | POA: Diagnosis not present

## 2019-06-12 ENCOUNTER — Other Ambulatory Visit: Payer: Self-pay | Admitting: Family Medicine

## 2019-06-12 DIAGNOSIS — I10 Essential (primary) hypertension: Secondary | ICD-10-CM

## 2019-10-07 ENCOUNTER — Ambulatory Visit: Payer: Medicare Other | Attending: Internal Medicine

## 2019-10-07 DIAGNOSIS — Z23 Encounter for immunization: Secondary | ICD-10-CM | POA: Insufficient documentation

## 2019-10-07 NOTE — Progress Notes (Signed)
   Covid-19 Vaccination Clinic  Name:  Laurie Dunn    MRN: [id number] DOB: [birth year]  10/07/2019  [name] was observed post Covid-19 immunization for 15 minutes without incidence. She was provided with Vaccine Information Sheet and instruction to access the V-Safe system.   [name] was instructed to call 911 with any severe reactions post vaccine: Marland Kitchen Difficulty breathing  . Swelling of your face and throat  . A fast heartbeat  . A bad rash all over your body  . Dizziness and weakness    Immunizations Administered    Name Date Dose VIS Date Route   Pfizer COVID-19 Vaccine 10/07/2019  2:47 PM 0.3 mL 08/04/2019 Intramuscular   Manufacturer: [place]   Lot: [id number]   [place]: [id number]

## 2019-10-30 ENCOUNTER — Ambulatory Visit: Payer: Medicare Other | Attending: Internal Medicine

## 2019-10-30 DIAGNOSIS — Z23 Encounter for immunization: Secondary | ICD-10-CM | POA: Insufficient documentation

## 2019-10-30 NOTE — Progress Notes (Signed)
   Covid-19 Vaccination Clinic  Name:  Laurie Dunn    MRN: [id number] DOB: 17-Nov-1945  10/30/2019  Ms. Laurie Dunn was observed post Covid-19 immunization for 15 minutes without incident. She was provided with Vaccine Information Sheet and instruction to access the V-Safe system.   Ms. Laurie Dunn was instructed to call 911 with any severe reactions post vaccine: Marland Kitchen Difficulty breathing  . Swelling of face and throat  . A fast heartbeat  . A bad rash all over body  . Dizziness and weakness   Immunizations Administered    Name Date Dose VIS Date Route   Pfizer COVID-19 Vaccine 10/30/2019  3:29 PM 0.3 mL 08/04/2019 Intramuscular   Manufacturer: [place]   Lot: [id number]   [place]: [id number]

## 2019-11-28 ENCOUNTER — Ambulatory Visit (INDEPENDENT_AMBULATORY_CARE_PROVIDER_SITE_OTHER): Payer: Medicare Other

## 2019-11-28 ENCOUNTER — Other Ambulatory Visit: Payer: Self-pay

## 2019-11-28 DIAGNOSIS — Z0000 Encounter for general adult medical examination without abnormal findings: Secondary | ICD-10-CM | POA: Diagnosis not present

## 2019-11-28 NOTE — Patient Instructions (Addendum)
You spoke to Laurie Dunn, [place] over the phone for your annual wellness visit.  We discussed goals: Goals    . Exercise 30 minutes per time     Encouraged to take 15 minutes of walking to 30 minutes.      We also discussed recommended health maintenance. As discussed, you are due for the following. Consider a stool test, FOB in office or Cologuard. We can send a Tdap prescription to your pharmacy if interested.  Health Maintenance  Topic Date Due  . TETANUS/TDAP  Never done  . MAMMOGRAM  Never done  . COLONOSCOPY  Never done  . INFLUENZA VACCINE  03/24/2020  . DEXA SCAN  Completed  . Hepatitis C Screening  Completed  . PNA vac Low Risk Adult  Completed   Office visit scheduled with PCP for 5/6 '@1'$ :30pm. Fill out an advance directive packet. Increase 58mns of walking to 30 minutes of walking per time. Congratulations on getting your covid vaccines!  Preventive Care 615Years and Older, Female Preventive care refers to lifestyle choices and visits with your health care provider that can promote health and wellness. This includes:  A yearly physical exam. This is also called an annual well check.  Regular dental and eye exams.  Immunizations.  Screening for certain conditions.  Healthy lifestyle choices, such as diet and exercise. What can I expect for my preventive care visit? Physical exam Your health care provider will check:  Height and weight. These may be used to calculate body mass index (BMI), which is a measurement that tells if you are at a healthy weight.  Heart rate and blood pressure.  Your skin for abnormal spots. Counseling Your health care provider may ask you questions about:  Alcohol, tobacco, and drug use.  Emotional well-being.  Home and relationship well-being.  Sexual activity.  Eating habits.  History of falls.  Memory and ability to understand (cognition).  Work and work eStatistician  Pregnancy and menstrual history. What  immunizations do I need?  Influenza (flu) vaccine  This is recommended every year. Tetanus, diphtheria, and pertussis (Tdap) vaccine  You may need a Td booster every 10 years. Varicella (chickenpox) vaccine  You may need this vaccine if you have not already been vaccinated. Zoster (shingles) vaccine  You may need this after age 408 Pneumococcal conjugate (PCV13) vaccine  One dose is recommended after age 73 Pneumococcal polysaccharide (PPSV23) vaccine  One dose is recommended after age 73 Measles, mumps, and rubella (MMR) vaccine  You may need at least one dose of MMR if you were born in 1957 or later. You may also need a second dose. Meningococcal conjugate (MenACWY) vaccine  You may need this if you have certain conditions. Hepatitis A vaccine  You may need this if you have certain conditions or if you travel or work in places where you may be exposed to hepatitis A. Hepatitis B vaccine  You may need this if you have certain conditions or if you travel or work in places where you may be exposed to hepatitis B. Haemophilus influenzae type b (Hib) vaccine  You may need this if you have certain conditions. You may receive vaccines as individual doses or as more than one vaccine together in one shot (combination vaccines). Talk with your health care provider about the risks and benefits of combination vaccines. What tests do I need? Blood tests  Lipid and cholesterol levels. These may be checked every 5 years, or more frequently depending on your overall  health.  Hepatitis C test.  Hepatitis B test. Screening  Lung cancer screening. You may have this screening every year starting at age 71 if you have a 30-pack-year history of smoking and currently smoke or have quit within the past 15 years.  Colorectal cancer screening. All adults should have this screening starting at age 43 and continuing until age 51. Your health care provider may recommend screening at age 37 if  you are at increased risk. You will have tests every 1-10 years, depending on your results and the type of screening test.  Diabetes screening. This is done by checking your blood sugar (glucose) after you have not eaten for a while (fasting). You may have this done every 1-3 years.  Mammogram. This may be done every 1-2 years. Talk with your health care provider about how often you should have regular mammograms.  BRCA-related cancer screening. This may be done if you have a family history of breast, ovarian, tubal, or peritoneal cancers. Other tests  Sexually transmitted disease (STD) testing.  Bone density scan. This is done to screen for osteoporosis. You may have this done starting at age 61. Follow these instructions at home: Eating and drinking  Eat a diet that includes fresh fruits and vegetables, whole grains, lean protein, and low-fat dairy products. Limit your intake of foods with high amounts of sugar, saturated fats, and salt.  Take vitamin and mineral supplements as recommended by your health care provider.  Do not drink alcohol if your health care provider tells you not to drink.  If you drink alcohol: ? Limit how much you have to 0-1 drink a day. ? Be aware of how much alcohol is in your drink. In the [place] one drink equals one 12 oz bottle of beer (355 mL), one 5 oz glass of wine (148 mL), or one 1 oz glass of hard liquor (44 mL). Lifestyle  Take daily care of your teeth and gums.  Stay active. Exercise for at least 30 minutes on 5 or more days each week.  Do not use any products that contain nicotine or tobacco, such as cigarettes, e-cigarettes, and chewing tobacco. If you need help quitting, ask your health care provider.  If you are sexually active, practice safe sex. Use a condom or other form of protection in order to prevent STIs (sexually transmitted infections).  Talk with your health care provider about taking a low-dose aspirin or statin. What's  next?  Go to your health care provider once a year for a well check visit.  Ask your health care provider how often you should have your eyes and teeth checked.  Stay up to date on all vaccines. This information is not intended to replace advice given to you by your health care provider. Make sure you discuss any questions you have with your health care provider. Document Revised: 08/04/2018 Document Reviewed: 08/04/2018 Elsevier Patient Education  2020 [place] clinic's number is [phone]. Please call with questions or concerns about what we discussed today.

## 2019-11-28 NOTE — Progress Notes (Addendum)
Subjective:   Laurie Dunn is a 74 y.o. female who presents for Medicare Annual (Subsequent) preventive examination.  The patient consented to a virtual visit.  Review of Systems: Defer to PCP.  Cardiac Risk Factors include: advanced age (>24men, >87 women)  Objective:   Vitals: There were no vitals taken for this visit.  There is no height or weight on file to calculate BMI.  Advanced Directives 11/28/2019 04/02/2017  Does Patient Have a Medical Advance Directive? No No  Would patient like information on creating a medical advance directive? Yes (MAU/Ambulatory/Procedural Areas - Information given) (No Data)   Tobacco Social History   Tobacco Use  Smoking Status Former Smoker  . Types: Cigarettes  . Quit date: 08/24/2012  . Years since quitting: 7.2  Smokeless Tobacco Never Used     Counseling given: still former smoker- no plans to restart  Clinical Intake:  Pre-visit preparation completed: Yes  Pain Score: 0-No pain  How often do you need to have someone help you when you read instructions, pamphlets, or other written materials from your doctor or pharmacy?: 1 - Never What is the last grade level you completed in school?: masters degree  Interpreter Needed?: No  Past Medical History:  Diagnosis Date  . GERD (gastroesophageal reflux disease)   . Hypertension   . Mild emphysema (HCC)    Past Surgical History:  Procedure Laterality Date  . arm  Left 2019   Pins- broke left arm   . DILATION AND CURETTAGE OF UTERUS  1981  . oopherrectomy    . TONSILLECTOMY  1953   Family History  Problem Relation Age of Onset  . Cancer Mother        Kidney and Stomach   . Cancer Father        Brain Cancer   . Cancer Sister        Lung cancer   . Hypertension Brother    Social History   Socioeconomic History  . Marital status: Married    Spouse name: Laurie Dunn  . Number of children: 1  . Years of education: 31  . Highest education level: Master's degree (e.g., MA,  MS, MEng, MEd, MSW, MBA)  Occupational History  . Occupation: retired   Tobacco Use  . Smoking status: Former Smoker    Types: Cigarettes    Quit date: 08/24/2012    Years since quitting: 7.2  . Smokeless tobacco: Never Used  Substance and Sexual Activity  . Alcohol use: Yes    Alcohol/week: 10.0 standard drinks    Types: 10 Cans of beer per week    Comment: Per day drinks 3 or 4 beers   . Drug use: No  . Sexual activity: Not Currently  Other Topics Concern  . Not on file  Social History Narrative   Patient lives in [place] with her husband.    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   Patient has 3 dogs she rescued.       Patient contributes a lot of her stress to the current covid pandemic.    Social Determinants of Health   Financial Resource Strain: Low Risk   . Difficulty of Paying Living Expenses: Not hard at all  Food Insecurity: No Food Insecurity  . Worried About Charity fundraiser in the Last Year: Never true  . Ran Out of Food in the Last Year: Never true  Transportation Needs: No Transportation Needs  . Lack of Transportation (Medical): No  .  Lack of Transportation (Non-Medical): No  Physical Activity: Insufficiently Active  . Days of Exercise per Week: 7 days  . Minutes of Exercise per Session: 20 min  Stress: Stress Concern Present  . Feeling of Stress : To some extent  Social Connections: Moderately Isolated  . Frequency of Communication with Friends and Family: Once a week  . Frequency of Social Gatherings with Friends and Family: Once a week  . Attends Religious Services: Never  . Active Member of Clubs or Organizations: No  . Attends Archivist Meetings: Never  . Marital Status: Married   Outpatient Encounter Medications as of 11/28/2019  Medication Sig  . amLODipine (NORVASC) 5 MG tablet TAKE ONE TABLET BY MOUTH ONE TIME DAILY   . Calcium Citrate-Vitamin D (CITRACAL MAXIMUM) 315-250 MG-UNIT TABS Take 1  tablet by mouth daily.  Marland Kitchen lisinopril (ZESTRIL) 40 MG tablet TAKE ONE TABLET BY MOUTH ONE TIME DAILY   . cetirizine (ZYRTEC) 10 MG tablet Take 1 tablet (10 mg total) by mouth daily. (Patient not taking: Reported on 05/25/2018)  . fluticasone (FLONASE) 50 MCG/ACT nasal spray Place 1 spray into both nostrils daily. 1 spray in each nostril every day (Patient not taking: Reported on 05/25/2018)  . HYDROcodone-acetaminophen (NORCO/VICODIN) 5-325 MG tablet Take 1 tablet by mouth every 6 (six) hours as needed. (Patient not taking: Reported on 11/28/2019)  . meloxicam (MOBIC) 7.5 MG tablet Take 7.5 mg by mouth daily as needed (inflammation/pain).  . Omega-3 Fatty Acids (FISH OIL) 1000 MG CAPS Take 1,000 mg by mouth daily.  . ranitidine (ZANTAC) 150 MG capsule Take 1 capsule (150 mg total) by mouth daily as needed for heartburn. (Patient not taking: Reported on 11/28/2019)  . triamcinolone ointment (KENALOG) 0.1 % Apply 1 application topically 2 (two) times daily. (Patient not taking: Reported on 11/28/2019)  . [DISCONTINUED] oxyCODONE (OXY IR/ROXICODONE) 5 MG immediate release tablet Take 5 mg by mouth every 8 (eight) hours as needed for severe pain.   No facility-administered encounter medications on file as of 11/28/2019.   Activities of Daily Living In your present state of health, do you have any difficulty performing the following activities: 11/28/2019  Hearing? N  Vision? N  Difficulty concentrating or making decisions? N  Walking or climbing stairs? N  Dressing or bathing? N  Doing errands, shopping? N  Preparing Food and eating ? N  Using the Toilet? N  In the past six months, have you accidently leaked urine? N  Do you have problems with loss of bowel control? N  Managing your Medications? N  Managing your Finances? N  Housekeeping or managing your Housekeeping? N  Some recent data might be hidden   Patient Care Team: Laurie More, DO as PCP - General (Family Medicine)    Assessment:   This  is a routine wellness examination for Laurie Dunn.  Exercise Activities and Dietary recommendations Current Exercise Habits: Home exercise routine, Type of exercise: treadmill, Time (Minutes): 15, Frequency (Times/Week): 7, Weekly Exercise (Minutes/Week): 105, Exercise limited by: None identified  Goals    . Exercise 30 minutes per time     Encouraged to take 15 minutes of walking to 30 minutes.      Fall Risk Fall Risk  11/28/2019 03/23/2019 11/23/2017 04/02/2017 04/24/2016  Falls in the past year? 0 (No Data) No No No  Comment - Emmi Telephone Survey: data to providers prior to load - - Emmi Telephone Survey: data to providers prior to load  Number falls in past yr: - (  No Data) - - -  Comment - Emmi Telephone Survey Actual Response =  - - -   Is the patient's home free of loose throw rugs in walkways, pet beds, electrical cords, etc?   yes      Grab bars in the bathroom? no      Handrails on the stairs?   no      Adequate lighting?   yes  Patient rating of health (0-10) scale: 10  Depression Screen PHQ 2/9 Scores 11/28/2019 08/20/2017 04/02/2017  PHQ - 2 Score 2 0 0  PHQ- 9 Score 6 - -    Cognitive Function MMSE - Mini Mental State Exam 11/23/2017  Orientation to time 5  Orientation to Place 5  Registration 3  Attention/ Calculation 5  Recall 3  Language- name 2 objects 2  Language- repeat 1  Language- follow 3 step command 3  Language- read & follow direction 1  Write a sentence 1  Copy design 1  Total score 30   6CIT Screen 11/28/2019  What Year? 0 points  What month? 0 points  What time? 0 points  Count back from 20 0 points  Months in reverse 0 points  Repeat phrase 0 points  Total Score 0   Immunization History  Administered Date(s) Administered  . Influenza, High Dose Seasonal PF 08/03/2018  . Influenza,inj,Quad PF,6+ Mos 06/17/2017  . PFIZER SARS-COV-2 Vaccination 10/07/2019, 10/30/2019  . Pneumococcal Conjugate-13 06/17/2017  . Pneumococcal Polysaccharide-23  10/24/2018   Screening Tests Health Maintenance  Topic Date Due  . TETANUS/TDAP  Never done  . MAMMOGRAM  Never done  . COLONOSCOPY  Never done  . INFLUENZA VACCINE  03/24/2020  . DEXA SCAN  Completed  . Hepatitis C Screening  Completed  . PNA vac Low Risk Adult  Completed   Cancer Screenings: Lung: Low Dose CT Chest recommended if Age 31-80 years, 30 pack-year currently smoking OR have quit w/in 15years. Patient does not qualify. Breast:  Up to date on Mammogram? No   Up to date of Bone Density/Dexa? Yes Colorectal: Never- counseling given  Additional Screenings: Hepatitis C Screening: Completed   Plan:  Office visit scheduled with PCP for 5/6 @1 :30pm. Fill out an advance directive packet. Increase 64mins of walking to 30 minutes of walking per time. Congratulations on getting your covid vaccines!  I have personally reviewed and noted the following in the patient's chart:   . Medical and social history . Use of alcohol, tobacco or illicit drugs  . Current medications and supplements . Functional ability and status . Nutritional status . Physical activity . Advanced directives . List of other physicians . Hospitalizations, surgeries, and ER visits in previous 12 months . Vitals . Screenings to include cognitive, depression, and falls . Referrals and appointments  In addition, I have reviewed and discussed with patient certain preventive protocols, quality metrics, and best practice recommendations. A written personalized care plan for preventive services as well as general preventive health recommendations were provided to patient.  This visit was conducted virtually in the setting of the [place] pandemic.    Dorna Bloom, [place]  11/28/2019     I have reviewed this visit and agree with the documentation.

## 2019-12-27 NOTE — Progress Notes (Signed)
Subjective:  CC -- Annual Physical; With complaints of none  Pt reports she she is overall doing well   Hypertension: - Medications: amlodipine 5mg , lisinopril 40mg  - Compliance: good  - Checking BP at home: usually diastolic is 70, systolic is around [id number] - Denies any SOB, CP, vision changes, LE edema, medication SEs, or symptoms of hypotension - Diet: vegan - Exercise: minimal; plays with dogs in yard, 78min on treadmill a day, swinging   General Healthcare: Medication Compliance: yes  Cardiac Risk as of 12/28/19(date):    The 10-year ASCVD risk score Mikey Bussing DC Brooke Bonito., et al., 2013) is: 20.4%   Values used to calculate the score:     Age: 74 years     Sex: Female     Is Non-Hispanic African American: No     Diabetic: No     Tobacco smoker: No     Systolic Blood Pressure: 0000000 mmHg     Is BP treated: Yes     HDL Cholesterol: 61 mg/dL     Total Cholesterol: 173 mg/dL Aspirin: no   Dx Hypertension: yes  Dx Hyperlipidemia: no  Diabetes: no   Dx Obesity: no  Weight Loss: no  Physical Activity: yes  Urinary Incontinence: no    Social: Driving: no   Alcohol Use: yes, patient drinks 4beers a day.  Is not interested in cutting back at this time but is thinking about it.  Discussed with her the risks of DTs.  She will contact me if she is considering cutting back.  She will also contact alcohol cessation resources. Tobacco Use: no   - Interested in [place]: quit 10 years ago   Other Drugs: no  Independence: does ADLs independently  Depression: no   - PHQ2 score: 0   Office Visit from 12/28/2019 in [facility]  PHQ-2 Total Score  0    Support and Life at Home: yes  Advanced Directives: yes, needs it notorized    Cancer:   Colorectal >> Colonoscopy: no, discussed risks and benefits. Patient does not desire at this time   Lung >> Tobacco Use: yes; 1.5ppd x27yrs, quit 10 years ago  - If so, previous Low-Dose CT screen: no, will schedule today   Breast >>  Mammogram: will order today   Cervical/Endometrial >>  - Postmenopausal: yes - Vaginal Bleeding: no - Pap Smear: no   - Previous Abnormal Pap: no   Skin >> Suspicious lesions: no   Other: Osteoporosis: yes   Zoster Vaccine: [facility]   Flu Vaccine: yes  Pneumonia Vaccine: yes   COVID vaccine: yes   Past Medical History Patient Active Problem List   Diagnosis Date Noted  . Contact dermatitis 08/03/2018  . Cognitive change 08/20/2017  . Osteoporosis 05/03/2017  . Alcohol abuse 04/28/2017  . 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 Hypertension 04/02/2017  . Former smoker 04/02/2017    Medications- reviewed and updated Current Outpatient Medications  Medication Sig Dispense Refill  . amLODipine (NORVASC) 5 MG tablet TAKE ONE TABLET BY MOUTH ONE TIME DAILY  90 tablet 3  . Calcium Citrate-Vitamin D (CITRACAL MAXIMUM) 315-250 MG-UNIT TABS Take 1 tablet by mouth daily.    . cetirizine (ZYRTEC) 10 MG tablet Take 1 tablet (10 mg total) by mouth daily. (Patient not taking: Reported on 05/25/2018) 30 tablet 5  . fluticasone (FLONASE) 50 MCG/ACT nasal spray Place 1 spray into both nostrils daily. 1 spray in each nostril every day (Patient not taking: Reported  on 05/25/2018) 16 g 5  . HYDROcodone-acetaminophen (NORCO/VICODIN) 5-325 MG tablet Take 1 tablet by mouth every 6 (six) hours as needed. (Patient not taking: Reported on 11/28/2019) 10 tablet 0  . lisinopril (ZESTRIL) 40 MG tablet TAKE ONE TABLET BY MOUTH ONE TIME DAILY  90 tablet 3  . meloxicam (MOBIC) 7.5 MG tablet Take 7.5 mg by mouth daily as needed (inflammation/pain).    . Omega-3 Fatty Acids (FISH OIL) 1000 MG CAPS Take 1,000 mg by mouth daily.    . ranitidine (ZANTAC) 150 MG capsule Take 1 capsule (150 mg total) by mouth daily as needed for heartburn. (Patient not taking: Reported on 11/28/2019) 30 capsule 2  . triamcinolone ointment (KENALOG) 0.1 % Apply 1 application topically 2 (two) times daily. (Patient not taking: Reported on  11/28/2019) 80 g 1   No current facility-administered medications for this visit.    Objective: BP (!) 152/80   Pulse 71   Ht 5\' 4"  (1.626 m)   Wt 162 lb 9.6 oz (73.8 kg)   SpO2 97%   BMI 27.91 kg/m  Gen: NAD, alert, cooperative with exam HEENT: NCAT, EOMI, PERRL CV: RRR, good S1/S2, no murmur Resp: CTABL, no wheezes, non-labored Abd: Soft, Non Tender, Non Distended, BS present, no guarding or organomegaly Genital Exam: not done Ext: No edema, warm Neuro: Alert and oriented, No gross deficits   Assessment/Plan:  No problem-specific Assessment & Plan notes found for this encounter.   No orders of the defined types were placed in this encounter.   No orders of the defined types were placed in this encounter.    Caroline More, DO, PGY-3 12/28/2019 1:34 PM

## 2019-12-28 ENCOUNTER — Other Ambulatory Visit: Payer: Self-pay

## 2019-12-28 ENCOUNTER — Ambulatory Visit (INDEPENDENT_AMBULATORY_CARE_PROVIDER_SITE_OTHER): Payer: Medicare Other | Admitting: Family Medicine

## 2019-12-28 ENCOUNTER — Encounter: Payer: Self-pay | Admitting: Family Medicine

## 2019-12-28 VITALS — BP 152/80 | HR 71 | Ht 64.0 in | Wt 162.6 lb

## 2019-12-28 DIAGNOSIS — I10 Essential (primary) hypertension: Secondary | ICD-10-CM

## 2019-12-28 DIAGNOSIS — F1010 Alcohol abuse, uncomplicated: Secondary | ICD-10-CM | POA: Diagnosis not present

## 2019-12-28 DIAGNOSIS — Z720 Tobacco use: Secondary | ICD-10-CM | POA: Diagnosis not present

## 2019-12-28 DIAGNOSIS — Z1231 Encounter for screening mammogram for malignant neoplasm of breast: Secondary | ICD-10-CM

## 2019-12-28 DIAGNOSIS — Z0000 Encounter for general adult medical examination without abnormal findings: Secondary | ICD-10-CM | POA: Diagnosis not present

## 2019-12-28 DIAGNOSIS — Z87891 Personal history of nicotine dependence: Secondary | ICD-10-CM

## 2019-12-28 DIAGNOSIS — E7800 Pure hypercholesterolemia, unspecified: Secondary | ICD-10-CM

## 2019-12-28 NOTE — Assessment & Plan Note (Signed)
Slightly elevated today.  Appears to have normotensive blood pressures at home.  Shared decision making with patient.  She has opted for increasing exercise and improving diet rather than starting a new medicine or increasing amlodipine.  Given that patient is normotensive at home we will plan to continue current regimen.  Want to avoid hypotension especially given age.  Advise healthy diet and daily exercise.  Is to follow-up in 1 month so we can ensure adequate control.  If elevated at that time can increase amlodipine to 10 mg.

## 2019-12-28 NOTE — Assessment & Plan Note (Signed)
Patient is eligible for low-dose CT per USPS TF guidelines.  Patient is agreeable to this.  Schedule for patient prior to leaving clinic.  I congratulated her on continuing smoking cessation.

## 2019-12-28 NOTE — Patient Instructions (Addendum)
[facility] Offers intensive outpatient and inpatient; detox provided Insurance accepted: medicaid; provides services to uninsured and lower income  [place], [place] [address] [facility] amb[facility] [address], [place] [address] [facility] also offers resources, please call to confirm they will accept your insurance   1.  I have ordered your mammogram, they should call with the date and time 2.  Please consider obtaining a colonoscopy 3.  You can obtain both your shingles vaccine and tetanus vaccine at a pharmacy 4.  Your low-dose lung CT was scheduled for you prior to leaving clinic

## 2019-12-28 NOTE — Assessment & Plan Note (Signed)
Patient reports that she is not considering cutting down at this time but may in the future.  Resources for alcohol cessation given to patient.  Discussed with Dr. Hartford Poli.

## 2019-12-28 NOTE — Assessment & Plan Note (Signed)
Mammogram ordered today CMP and lipid panel obtained today Patient refusing colonoscopy at this time Patient will obtain Tdap and shingles vaccine at a pharmacy

## 2019-12-29 ENCOUNTER — Encounter: Payer: Self-pay | Admitting: Family Medicine

## 2019-12-29 LAB — LIPID PANEL
Chol/HDL Ratio: 2.7 ratio (ref 0.0–4.4)
Cholesterol, Total: 174 mg/dL (ref 100–199)
HDL: 64 mg/dL (ref >39)
LDL Chol Calc (NIH): 97 mg/dL (ref 0–99)
Triglycerides: 67 mg/dL (ref 0–149)
VLDL Cholesterol Cal: 13 mg/dL (ref 5–40)

## 2019-12-29 LAB — COMPREHENSIVE METABOLIC PANEL
ALT: 36 IU/L — ABNORMAL HIGH (ref 0–32)
AST: 34 IU/L (ref 0–40)
Albumin/Globulin Ratio: 1.7 (ref 1.2–2.2)
Albumin: 4.4 g/dL (ref 3.7–4.7)
Alkaline Phosphatase: 128 IU/L — ABNORMAL HIGH (ref 39–117)
BUN/Creatinine Ratio: 16 (ref 12–28)
BUN: 14 mg/dL (ref 8–27)
Bilirubin Total: 1 mg/dL (ref 0.0–1.2)
CO2: 23 mmol/L (ref 20–29)
Calcium: 9.9 mg/dL (ref 8.7–10.3)
Chloride: 105 mmol/L (ref 96–106)
Creatinine, Ser: 0.85 mg/dL (ref 0.57–1.00)
GFR calc Af Amer: 79 mL/min/{1.73_m2} (ref >59)
GFR calc non Af Amer: 69 mL/min/{1.73_m2} (ref >59)
Globulin, Total: 2.6 g/dL (ref 1.5–4.5)
Glucose: 82 mg/dL (ref 65–99)
Potassium: 4.8 mmol/L (ref 3.5–5.2)
Sodium: 141 mmol/L (ref 134–144)
Total Protein: 7 g/dL (ref 6.0–8.5)

## 2020-01-03 ENCOUNTER — Telehealth: Payer: Self-pay | Admitting: Family Medicine

## 2020-01-03 NOTE — Telephone Encounter (Signed)
Laurie Dunn from [facility] called requesting the order for CT Chest is signed for Friday.  Her phone number is [phone]

## 2020-01-04 ENCOUNTER — Other Ambulatory Visit: Payer: Self-pay | Admitting: Family Medicine

## 2020-01-04 DIAGNOSIS — Z122 Encounter for screening for malignant neoplasm of respiratory organs: Secondary | ICD-10-CM

## 2020-01-04 DIAGNOSIS — Z87891 Personal history of nicotine dependence: Secondary | ICD-10-CM

## 2020-01-05 ENCOUNTER — Ambulatory Visit (HOSPITAL_COMMUNITY)
Admission: RE | Admit: 2020-01-05 | Discharge: 2020-01-05 | Disposition: A | Discharge status: Home / Self Care | Payer: Medicare Other | Source: Ambulatory Visit | Attending: Family Medicine | Admitting: Family Medicine

## 2020-01-05 ENCOUNTER — Other Ambulatory Visit: Payer: Self-pay

## 2020-01-05 DIAGNOSIS — Z122 Encounter for screening for malignant neoplasm of respiratory organs: Secondary | ICD-10-CM | POA: Diagnosis not present

## 2020-01-05 DIAGNOSIS — Z87891 Personal history of nicotine dependence: Secondary | ICD-10-CM | POA: Diagnosis not present

## 2020-01-09 ENCOUNTER — Ambulatory Visit
Admission: RE | Admit: 2020-01-09 | Discharge: 2020-01-09 | Disposition: A | Discharge status: Home / Self Care | Payer: Medicare Other | Source: Ambulatory Visit | Attending: Family Medicine | Admitting: Family Medicine

## 2020-01-09 ENCOUNTER — Other Ambulatory Visit: Payer: Self-pay | Admitting: Family Medicine

## 2020-01-09 DIAGNOSIS — Z1231 Encounter for screening mammogram for malignant neoplasm of breast: Secondary | ICD-10-CM

## 2020-01-29 ENCOUNTER — Telehealth: Payer: Self-pay

## 2020-01-29 NOTE — Telephone Encounter (Signed)
Patient calls nurse line requesting referral for dermatologist. Patient had office visit with PCP on 12/28/2019. Patient states that husband noticed "two gross looking moles"   Please advise if referral can be sent in or if patient needs to be re evaluated in clinic.   To PCP  Talbot Grumbling, RN

## 2020-01-30 ENCOUNTER — Other Ambulatory Visit: Payer: Self-pay | Admitting: Family Medicine

## 2020-01-30 DIAGNOSIS — D229 Melanocytic nevi, unspecified: Secondary | ICD-10-CM

## 2020-01-30 NOTE — Telephone Encounter (Signed)
Please inform patient that referral has been placed. Please inform patient that we have a dermatology clinic where she could likely be seen sooner here in our clinic so if she desires she can be seen by a resident in clinic to determine if dermatology clinic would be appropriate for her.   Dalphine Handing, PGY-3 [facility] 01/30/2020 8:10 PM

## 2020-01-30 NOTE — Telephone Encounter (Signed)
Patient returns call to nurse line to check on status of referral.   To PCP  Talbot Grumbling, RN

## 2020-01-31 NOTE — Telephone Encounter (Signed)
LVM for patient that referral has been placed.  Laurie Dunn, [place]

## 2020-04-18 ENCOUNTER — Ambulatory Visit (INDEPENDENT_AMBULATORY_CARE_PROVIDER_SITE_OTHER): Payer: Medicare Other | Admitting: Dermatology

## 2020-04-18 ENCOUNTER — Encounter: Payer: Self-pay | Admitting: Dermatology

## 2020-04-18 ENCOUNTER — Other Ambulatory Visit: Payer: Self-pay

## 2020-04-18 DIAGNOSIS — D1801 Hemangioma of skin and subcutaneous tissue: Secondary | ICD-10-CM | POA: Diagnosis not present

## 2020-04-18 DIAGNOSIS — D225 Melanocytic nevi of trunk: Secondary | ICD-10-CM

## 2020-04-18 DIAGNOSIS — L729 Follicular cyst of the skin and subcutaneous tissue, unspecified: Secondary | ICD-10-CM

## 2020-04-18 DIAGNOSIS — L821 Other seborrheic keratosis: Secondary | ICD-10-CM | POA: Diagnosis not present

## 2020-04-18 DIAGNOSIS — D229 Melanocytic nevi, unspecified: Secondary | ICD-10-CM

## 2020-04-18 DIAGNOSIS — L918 Other hypertrophic disorders of the skin: Secondary | ICD-10-CM | POA: Diagnosis not present

## 2020-04-18 NOTE — Patient Instructions (Signed)
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.  Skin examination of essentially all skin from the waist up showed no atypical moles or skin cancer.  Relatively new growths on the upper central back were 6 mm textured brown elevations called seborrheic keratoses; there is similar spots on the chest.  These are not moles and do not require removal.  Flatter tan subtly textured spots mainly on the right arm are called stucco keratoses and are likewise benign.  She has multiple 2 mm red dots "cherry angiomas (1 on the right lower back is a bit darker but also benign).  On the neck are several skin tags (1 on the right front neck is a combination of attack plus a benign keratosis).  Finally there are 2 small cysts on the upper back.  We discussed warning signs of melanoma.  [name] should self examine her skin with her husband twice annually and can decide whether she wants to return on an as-needed basis or for an annual general skin examination.

## 2020-04-22 ENCOUNTER — Encounter: Payer: Self-pay | Admitting: Dermatology

## 2020-04-22 NOTE — Progress Notes (Signed)
   New Patient   Subjective  Laurie Dunn is a 74 y.o. female who presents for the following: Skin Problem (funny looking moles on back).  Moles Location: Upper back Duration: Just the past 1 to 2 years Quality:  Associated Signs/Symptoms: Modifying Factors:  Severity:  Timing: Context:    The following portions of the chart were reviewed this encounter and updated as appropriate: Tobacco  Allergies  Meds  Problems  Med Hx  Surg Hx  Fam Hx      Objective  Well appearing patient in no apparent distress; mood and affect are within normal limits.  All skin waist up examined.   Assessment & Plan  Seborrheic keratosis Mid Back  Leave if stable  Nevus Mid Back  Self examine skin twice annually  Initial visit for retired [employer].  Skin examination of essentially all skin from the waist up showed no atypical moles or skin cancer.  Relatively new growths on the upper central back were 6 mm textured brown elevations called seborrheic keratoses; there is similar spots on the chest.  These are not moles and do not require removal.  Flatter tan subtly textured spots mainly on the right arm are called stucco keratoses and are likewise benign.  She has multiple 2 mm red dots "cherry angiomas (1 on the right lower back is a bit darker but also benign).  On the neck are several skin tags (1 on the right front neck is a combination of attack plus a benign keratosis).  Finally there are 2 small cysts on the upper back.  We discussed warning signs of melanoma.  Laurie Dunn should self examine her skin with her husband twice annually and can decide whether she wants to return on an as-needed basis or for an annual general skin examination.

## 2020-04-25 DIAGNOSIS — Z23 Encounter for immunization: Secondary | ICD-10-CM | POA: Diagnosis not present

## 2020-05-13 DIAGNOSIS — Z23 Encounter for immunization: Secondary | ICD-10-CM | POA: Diagnosis not present

## 2020-05-23 ENCOUNTER — Ambulatory Visit: Payer: Medicare Other | Admitting: Physician Assistant

## 2020-06-05 ENCOUNTER — Other Ambulatory Visit: Payer: Self-pay

## 2020-06-05 DIAGNOSIS — I10 Essential (primary) hypertension: Secondary | ICD-10-CM

## 2020-06-05 MED ORDER — AMLODIPINE BESYLATE 5 MG PO TABS: 5.0000 mg | ORAL_TABLET | Freq: Every day | ORAL | 3 refills | Status: DC

## 2020-06-05 MED ORDER — LISINOPRIL 40 MG PO TABS: 40.0000 mg | ORAL_TABLET | Freq: Every day | ORAL | 3 refills | Status: DC

## 2020-07-29 IMAGING — DX DG SHOULDER 2+V*L*
3 series · 3 of 3 positions shown · non-contrast
Comparison: None.

CLINICAL DATA: Patient states that she fell today when she tripped
over her dog, pain along left humeral head and into shoulder joint.
No Hx

EXAM:
LEFT SHOULDER - 2+ VIEW

[shoulder ap]
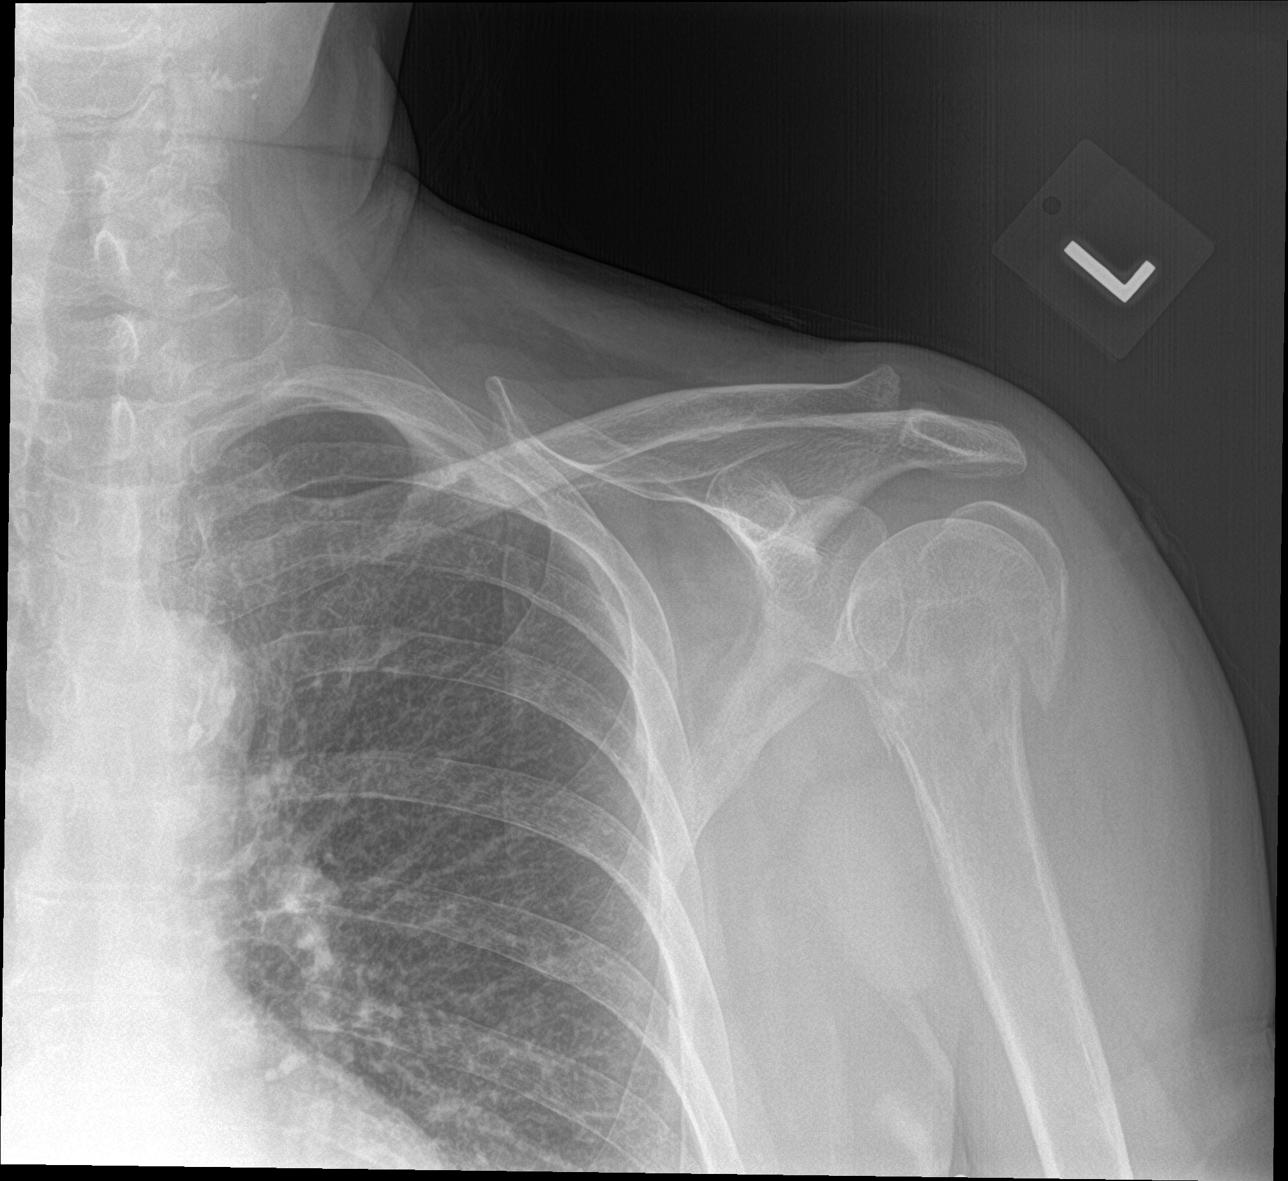

[shoulder grashey]
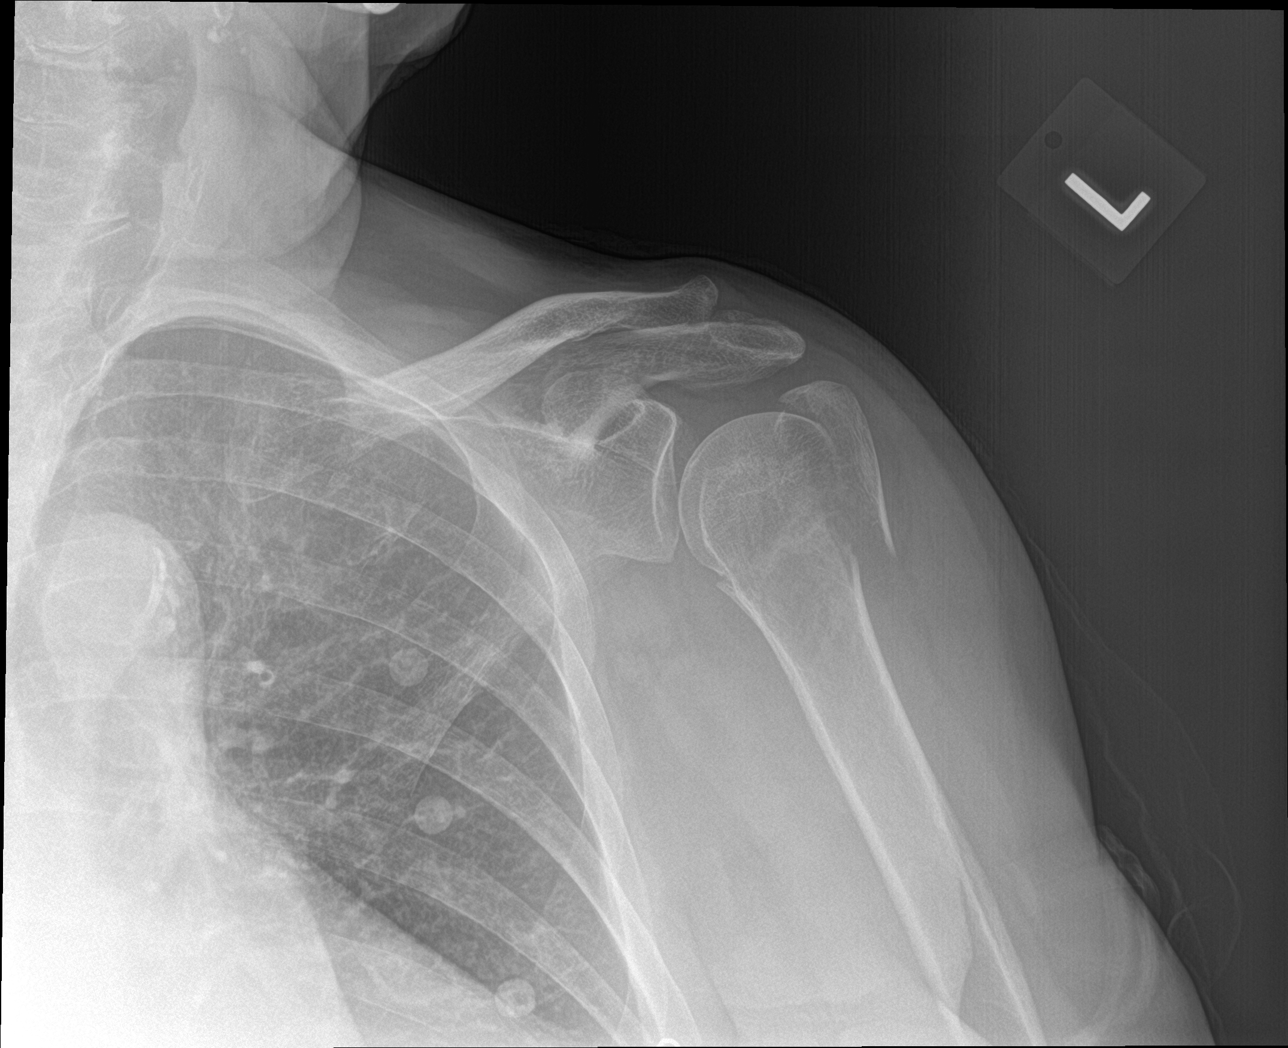

[shoulder y-view]
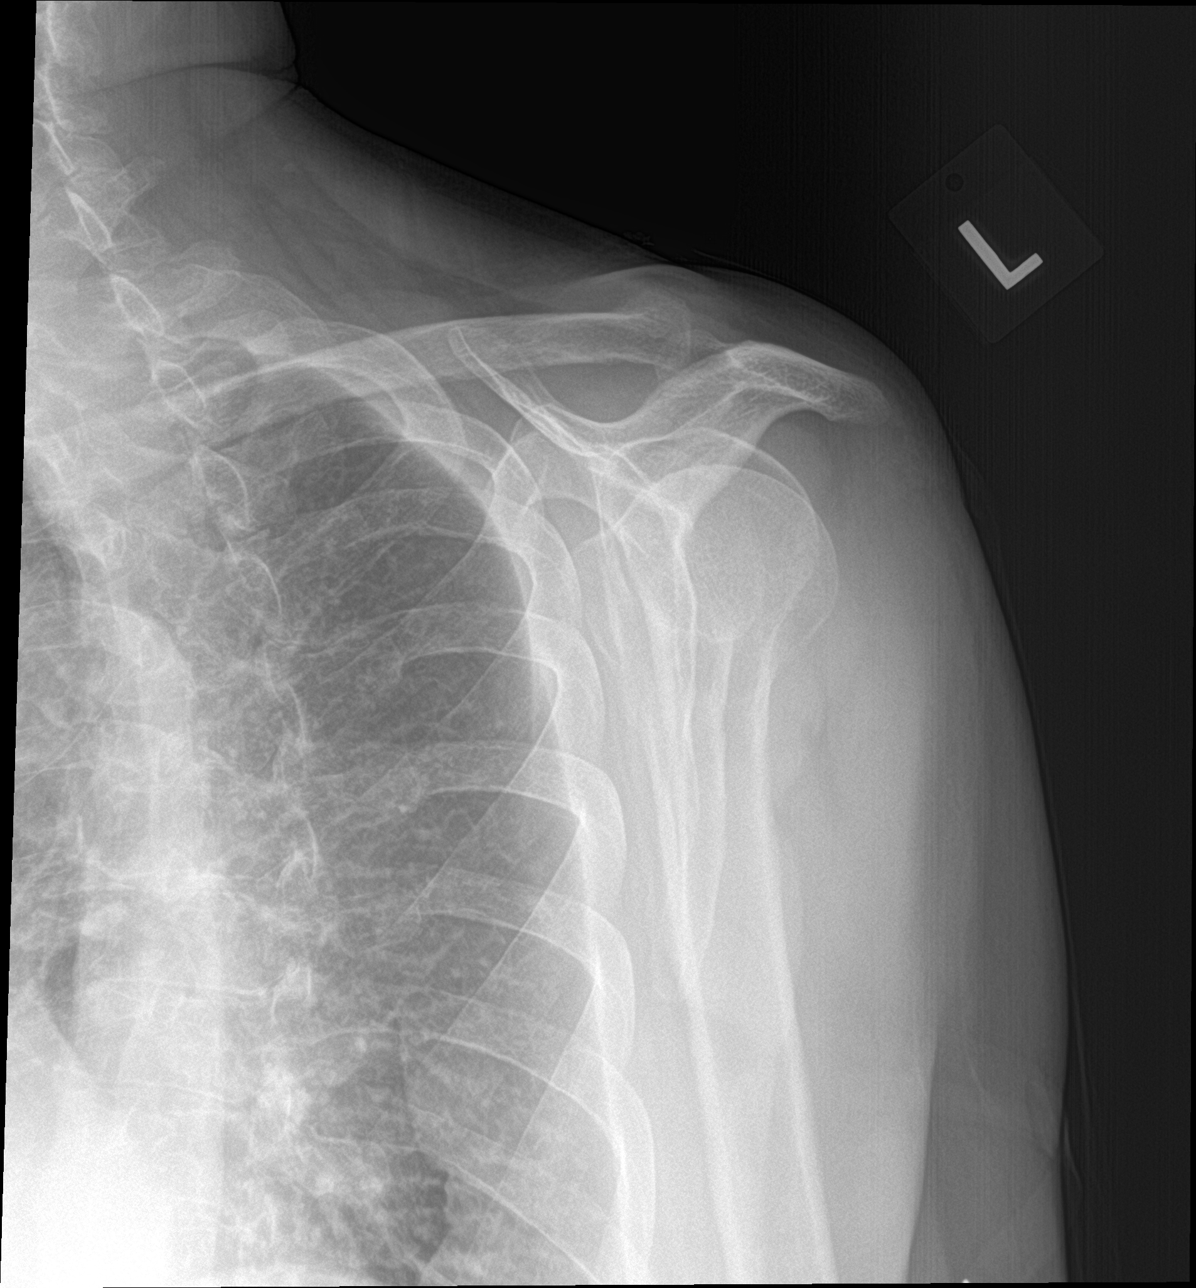

[3 of 3 positions shown; findings below may reference images not displayed]

FINDINGS: There is a comminuted fracture of the LEFT humeral head and neck.
There is a discrete greater tuberosity fragment. No evidence for
dislocation. The LEFT lung apex is unremarkable.
IMPRESSION: Comminuted fracture of the LEFT humeral head and neck.

## 2020-10-16 ENCOUNTER — Ambulatory Visit (INDEPENDENT_AMBULATORY_CARE_PROVIDER_SITE_OTHER): Payer: Medicare Other | Admitting: Family Medicine

## 2020-10-16 ENCOUNTER — Other Ambulatory Visit: Payer: Self-pay

## 2020-10-16 ENCOUNTER — Encounter: Payer: Self-pay | Admitting: Family Medicine

## 2020-10-16 DIAGNOSIS — F419 Anxiety disorder, unspecified: Secondary | ICD-10-CM

## 2020-10-16 DIAGNOSIS — L301 Dyshidrosis [pompholyx]: Secondary | ICD-10-CM

## 2020-10-16 DIAGNOSIS — I10 Essential (primary) hypertension: Secondary | ICD-10-CM

## 2020-10-16 DIAGNOSIS — F1010 Alcohol abuse, uncomplicated: Secondary | ICD-10-CM

## 2020-10-16 DIAGNOSIS — L309 Dermatitis, unspecified: Secondary | ICD-10-CM | POA: Diagnosis not present

## 2020-10-16 MED ORDER — CLOBETASOL PROPIONATE 0.05 % EX OINT: 1.0000 "application " | TOPICAL_OINTMENT | Freq: Two times a day (BID) | CUTANEOUS | 2 refills | Status: DC

## 2020-10-16 MED ORDER — BUSPIRONE HCL 5 MG PO TABS: 5.0000 mg | ORAL_TABLET | Freq: Three times a day (TID) | ORAL | 3 refills | Status: DC

## 2020-10-16 MED ORDER — TRIAMCINOLONE ACETONIDE 0.1 % EX OINT: 1.0000 "application " | TOPICAL_OINTMENT | Freq: Two times a day (BID) | CUTANEOUS | 2 refills | Status: AC

## 2020-10-16 NOTE — Patient Instructions (Signed)
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  Both are treated with topical steroids.  The hands need a higher potency. I prescribed an immediate acting anxiety medication.  I hope it helps.  You are right.  It is in your best interest to not self medicate. Please check back with in 3-4 weeks with Dr. Quentin [place].  I want to make sure that the problems are improving.

## 2020-10-17 ENCOUNTER — Encounter: Payer: Self-pay | Admitting: Family Medicine

## 2020-10-17 NOTE — Assessment & Plan Note (Signed)
We discussed longer acting treatment (eg SSRI) and short acting drugs.  Her strong preference was for short acting.  Will start buspar.

## 2020-10-17 NOTE — Assessment & Plan Note (Signed)
I am going to treat neck and foot as eczema.  For the neck, intertrigo is possible.  For foot, much less likely is tinea.  No KOH available.  Treat both with moderate potency steroid.

## 2020-10-17 NOTE — Assessment & Plan Note (Signed)
I will trust her judgment that better treatment of her anxiety will help her decrease her alcohol consumption.

## 2020-10-17 NOTE — Progress Notes (Signed)
    SUBJECTIVE:   CHIEF COMPLAINT / HPI:   Rash and more. 1. Longstanding rash on hands.  Dry, cracked and painful.  Began with the marked increase in hand washing with COVID.  Hand lotions have not helped.  2. Rash on foot and back of neck.  Both present for a couple of weeks.  Both pruritic.  No fever of systemic sx of illness.  3. Alcohol abuse.  States she is drinking too much beer each night.  She is isolated with COVID and feels this has significantly increased her baseline anxiety.  She characterizes her drinking as self-medication.  Asks for anxiolytic to help her decrease alcohol intake.  The only medication she has previously taken is Valium in the remote past.    OBJECTIVE:   BP (!) 162/78   Pulse 87   Ht 5\' 4"  (1.626 m)   Wt 166 lb 12.8 oz (75.7 kg)   SpO2 97%   BMI 28.63 kg/m   Skin Neck. Rash in post neck fold Hands: significant peeling and cracking on thick skin Foot: Dry scaley rash on dorsum at ankle.  ASSESSMENT/PLAN:   Dyshidrotic eczema I am confident in the dyshidrotic eczema dx.  The rash is classic.  High potency steroid for this rash.  Eczema I am going to treat neck and foot as eczema.  For the neck, intertrigo is possible.  For foot, much less likely is tinea.  No KOH available.  Treat both with moderate potency steroid.  Anxiety We discussed longer acting treatment (eg SSRI) and short acting drugs.  Her strong preference was for short acting.  Will start buspar.  Alcohol abuse I will trust her judgment that better treatment of her anxiety will help her decrease her alcohol consumption.  Hypertension BP noted to be elevated.  She will follow up in one month to for BP recheck and to see how these various treatments are working.       Zenia Resides, MD [place]

## 2020-10-17 NOTE — Assessment & Plan Note (Signed)
BP noted to be elevated.  She will follow up in one month to for BP recheck and to see how these various treatments are working.

## 2020-10-17 NOTE — Assessment & Plan Note (Signed)
I am confident in the dyshidrotic eczema dx.  The rash is classic.  High potency steroid for this rash.

## 2020-11-07 ENCOUNTER — Other Ambulatory Visit: Payer: Self-pay | Admitting: Family Medicine

## 2020-11-07 DIAGNOSIS — F419 Anxiety disorder, unspecified: Secondary | ICD-10-CM

## 2020-12-23 DIAGNOSIS — Z23 Encounter for immunization: Secondary | ICD-10-CM | POA: Diagnosis not present

## 2021-06-03 ENCOUNTER — Other Ambulatory Visit: Payer: Self-pay | Admitting: Family Medicine

## 2021-06-03 DIAGNOSIS — I10 Essential (primary) hypertension: Secondary | ICD-10-CM

## 2021-06-03 DIAGNOSIS — F419 Anxiety disorder, unspecified: Secondary | ICD-10-CM

## 2022-01-27 ENCOUNTER — Encounter: Payer: Self-pay | Admitting: *Deleted

## 2022-02-27 ENCOUNTER — Other Ambulatory Visit: Payer: Self-pay | Admitting: Family Medicine

## 2022-02-27 DIAGNOSIS — L301 Dyshidrosis [pompholyx]: Secondary | ICD-10-CM
